# Patient Record
Sex: Female | Born: 1960 | Race: White | Hispanic: No | Marital: Single | Smoking: Never smoker
Health system: Southern US, Community
[De-identification: ages and names within clinical notes are randomized; demographics above are authoritative.]

## PROBLEM LIST (undated history)

## (undated) DIAGNOSIS — M1990 Unspecified osteoarthritis, unspecified site: Secondary | ICD-10-CM

## (undated) DIAGNOSIS — E785 Hyperlipidemia, unspecified: Secondary | ICD-10-CM

## (undated) DIAGNOSIS — I10 Essential (primary) hypertension: Secondary | ICD-10-CM

## (undated) DIAGNOSIS — F329 Major depressive disorder, single episode, unspecified: Secondary | ICD-10-CM

## (undated) DIAGNOSIS — E039 Hypothyroidism, unspecified: Secondary | ICD-10-CM

## (undated) HISTORY — DX: Unspecified osteoarthritis, unspecified site: M19.90

## (undated) HISTORY — DX: Essential (primary) hypertension: I10

## (undated) HISTORY — DX: Hypothyroidism, unspecified: E03.9

## (undated) HISTORY — DX: Major depressive disorder, single episode, unspecified: F32.9

## (undated) HISTORY — DX: Hyperlipidemia, unspecified: E78.5

---

## 1994-02-07 HISTORY — PX: INCISIONAL HERNIA REPAIR: SHX193

## 2007-02-08 HISTORY — PX: FOOT SURGERY: SHX648

## 2008-05-08 LAB — HM MAMMOGRAPHY: HM Mammogram: NORMAL

## 2008-05-08 LAB — CONVERTED CEMR LAB: Pap Smear: NORMAL

## 2009-01-15 ENCOUNTER — Ambulatory Visit: Payer: Self-pay | Admitting: Family Medicine

## 2009-01-15 DIAGNOSIS — E039 Hypothyroidism, unspecified: Secondary | ICD-10-CM

## 2009-01-15 DIAGNOSIS — I10 Essential (primary) hypertension: Secondary | ICD-10-CM

## 2009-01-15 DIAGNOSIS — E785 Hyperlipidemia, unspecified: Secondary | ICD-10-CM

## 2009-01-15 DIAGNOSIS — F3289 Other specified depressive episodes: Secondary | ICD-10-CM

## 2009-01-15 DIAGNOSIS — F329 Major depressive disorder, single episode, unspecified: Secondary | ICD-10-CM

## 2009-01-15 HISTORY — DX: Other specified depressive episodes: F32.89

## 2009-01-15 HISTORY — DX: Major depressive disorder, single episode, unspecified: F32.9

## 2009-01-15 HISTORY — DX: Hyperlipidemia, unspecified: E78.5

## 2009-01-15 HISTORY — DX: Hypothyroidism, unspecified: E03.9

## 2009-01-15 HISTORY — DX: Essential (primary) hypertension: I10

## 2009-02-26 ENCOUNTER — Ambulatory Visit: Payer: Self-pay | Admitting: Family Medicine

## 2009-07-24 ENCOUNTER — Telehealth: Payer: Self-pay | Admitting: Family Medicine

## 2010-01-13 ENCOUNTER — Ambulatory Visit: Payer: Self-pay | Admitting: Family Medicine

## 2010-01-29 ENCOUNTER — Ambulatory Visit: Payer: Self-pay | Admitting: Family Medicine

## 2010-01-29 ENCOUNTER — Other Ambulatory Visit
Admission: RE | Admit: 2010-01-29 | Discharge: 2010-01-29 | Payer: Self-pay | Source: Home / Self Care | Admitting: Family Medicine

## 2010-01-29 ENCOUNTER — Encounter: Payer: Self-pay | Admitting: Family Medicine

## 2010-02-02 LAB — CONVERTED CEMR LAB
Albumin: 4.3 g/dL (ref 3.5–5.2)
Basophils Absolute: 0.1 10*3/uL (ref 0.0–0.1)
Basophils Relative: 1 % (ref 0–1)
Chloride: 102 meq/L (ref 96–112)
HDL: 60 mg/dL (ref >39)
LDL Cholesterol: 126 mg/dL — ABNORMAL HIGH (ref 0–99)
MCHC: 33.7 g/dL (ref 30.0–36.0)
Monocytes Relative: 8 % (ref 3–12)
Neutro Abs: 5.3 10*3/uL (ref 1.7–7.7)
Neutrophils Relative %: 60 % (ref 43–77)
Potassium: 4.7 meq/L (ref 3.5–5.3)
RBC: 4.82 M/uL (ref 3.87–5.11)
RDW: 14.2 % (ref 11.5–15.5)
Total Protein: 6.8 g/dL (ref 6.0–8.3)
Triglycerides: 59 mg/dL (ref <150)
VLDL: 12 mg/dL (ref 0–40)

## 2010-02-04 LAB — CONVERTED CEMR LAB: Pap Smear: NEGATIVE

## 2010-03-09 NOTE — Assessment & Plan Note (Signed)
Summary: CHEST CONGESTION/COUGH/CJR   Vital Signs:  Patient profile:   50 year old female Menstrual status:  perimenopausal Temp:     99 degrees F oral BP sitting:   90 / 70  Vitals Entered By: Sid Falcon LPN (February 26, 2009 9:50 AM) CC: Chest congestion, chest pain with cough, hx bronchitis   History of Present Illness: Acute visit. Onset 4 days ago cough now productive greenish mucus. No history of asthma. No obvious wheezing. Denies any fever. Does have malaise. No hemoptysis. Nonsmoker. Difficulty getting over bronchial infections in the past.  Blood pressures have been well controlled. Currently Diovan 160 mg daily. No recent orthostatic symptoms.  Allergies: 1)  Ferrous Sulfate (Ferrous Sulfate) 2)  Erythromycin Base (Erythromycin Base)  Past History:  Past Medical History: Last updated: 01/15/2009 Depression Hyperlipidemia hypothyroidism  Social History: Last updated: 01/15/2009 Occupation:  Professor Single Never Smoked Alcohol use-yes Regular exercise-yes  Review of Systems      See HPI  Physical Exam  General:  Well-developed,well-nourished,in no acute distress; alert,appropriate and cooperative throughout examination Eyes:  No corneal or conjunctival inflammation noted. EOMI. P[place]la. Funduscopic exam benign, without hemorrhages, exudates or papilledema. Vision grossly normal. Ears:  External ear exam shows no significant lesions or deformities.  Otoscopic examination reveals clear canals, tympanic membranes are intact bilaterally without bulging, retraction, inflammation or discharge. Hearing is grossly normal bilaterally. Mouth:  Oral mucosa and oropharynx without lesions or exudates.  Teeth in good repair. Neck:  No deformities, masses, or tenderness noted. Lungs:  Normal respiratory effort, chest expands symmetrically. Lungs are clear to auscultation, no crackles or wheezes. Heart:  Normal rate and regular rhythm. S1 and S2 normal without gallop,  murmur, click, rub or other extra sounds.   Impression & Recommendations:  Problem # 1:  ACUTE BRONCHITIS (ICD-466.0)  Her updated medication list for this problem includes:    Azithromycin 250 Mg Tabs (Azithromycin) .Marland Kitchen... 2 by mouth today then one by mouth once daily for 4 days  Problem # 2:  HYPERTENSION (ICD-401.9) She will try reducing Diovan to one-half tablet once daily and monitor BP closely. Her updated medication list for this problem includes:    Diovan 160 Mg Tabs (Valsartan) ..... Once daily  Complete Medication List: 1)  Effexor Xr 150 Mg Xr24h-cap (Venlafaxine hcl) .... Once daily 2)  Diovan 160 Mg Tabs (Valsartan) .... Once daily 3)  Levothyroxine Sodium 112 Mcg Tabs (Levothyroxine sodium) .... One by mouth once daily 4)  Alprazolam 0.5 Mg Tabs (Alprazolam) .... One by mouth at bedtime as needed 5)  Azithromycin 250 Mg Tabs (Azithromycin) .... 2 by mouth today then one by mouth once daily for 4 days  Patient Instructions: 1)  Consider over-the-counter Mucinex 2 tablets twice daily 2)  Acute Bronchitis symptoms for less then 10 days are not  helped by antibiotics. Take over the counter cough medications. Call if no improvement in 5-7 days, sooner if increasing cough, fever, or new symptoms ( shortness of breath, chest pain) .  Prescriptions: AZITHROMYCIN 250 MG TABS (AZITHROMYCIN) 2 by mouth today then one by mouth once daily for 4 days  #6 x 0   Entered and Authorized by:   Evelena Peat MD   Signed by:   Evelena Peat MD on 02/26/2009   Method used:   Electronically to        [facility] # [phone]* (retail)       [address]       [place], [place]  [address]       Ph: [phone]       Fax: [phone]   RxID:   [id number]

## 2010-03-09 NOTE — Progress Notes (Signed)
Summary: REFILL REQUEST (Xanax)  Phone Note Refill Request Message from:  Patient on July 24, 2009 12:15 PM  Refills Requested: Medication #1:  ALPRAZOLAM 0.5 MG TABS one by mouth at bedtime as needed   Notes: [facility] [address] [facility] [address].  Pt has appts for labwork / cpx scheduled for December 2011.   Initial call taken by: Debbra Riding,  July 24, 2009 12:16 PM    Prescriptions: ALPRAZOLAM 0.5 MG TABS (ALPRAZOLAM) one by mouth at bedtime as needed  #30 x 0   Entered by:   Sid Falcon LPN   Authorized by:   Evelena Peat MD   Signed by:   Sid Falcon LPN on 36/64/4034   Method used:   Telephoned to ...       [facility] # [phone]* (retail)       [address]       [place], [place]  [address]       Ph: [phone]       Fax: [phone]   RxID:   [id number]

## 2010-03-11 NOTE — Assessment & Plan Note (Signed)
Summary: CPX (PAP) // RS pt rsc/njr   Vital Signs:  Patient profile:   50 year old female Menstrual status:  perimenopausal Height:      56 inches Weight:      110.5 pounds BMI:     24.86 O2 Sat:      94 % Temp:     98.3 degrees F Pulse rate:   82 / minute BP sitting:   112 / 72  (left arm) Cuff size:   regular  Vitals Entered By: Pura Spice, RN (January 29, 2010 1:44 PM) CC: cpx states fasting wants to reduce bp med, Hypertension Management   History of Present Illness: Here for CPE and follow up chronic problems.  Exercising regularly and generally feels well. tetanus is up to date Last pap about one and one half years ago.  Remote hx of "abnormal" pap. We have no record of that.  Apparently no signif dysplasia.  Depression stable on Effexor with good compliance and no side effects. Hypertension which has been very stable.  She wonders if she can scale back  meds.  Hypothryroidism.  NO symptoms of hypo or hyperthyroidism .  Pt compliant with meds.  Hypertension History:      She denies headache, chest pain, palpitations, dyspnea with exertion, orthopnea, peripheral edema, visual symptoms, neurologic problems, syncope, and side effects from treatment.        Positive major cardiovascular risk factors include hyperlipidemia and hypertension.  Negative major cardiovascular risk factors include female age less than 64 years old and non-tobacco-user status.     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 Smoked Alcohol use-yes Regular exercise-yes  Risk Factors: Exercise: yes (01/15/2009)  Risk Factors: Smoking Status: never (01/15/2009) PMH-FH-SH reviewed for relevance  Family History: Family History High cholesterol Family History Hypertension Adopted  Review of Systems  The patient denies anorexia, fever, weight loss, weight gain, vision loss, decreased hearing, hoarseness, chest pain, syncope, dyspnea on exertion, peripheral edema, prolonged cough, headaches, hemoptysis, abdominal pain, hematochezia, severe indigestion/heartburn, hematuria, incontinence, genital sores, muscle weakness, suspicious skin lesions, transient blindness, difficulty walking, depression, unusual weight change, abnormal bleeding, enlarged lymph nodes, and breast masses.         Flu Vaccine Consent Questions     Do you have a history of severe allergic reactions to this vaccine? no    Any prior history of allergic reactions to egg and/or gelatin? no    Do you have a sensitivity to the preservative Thimersol? no    Do you have a past history of Guillan-Barre Syndrome? no    Do you currently have an acute febrile illness? no    Have you ever had a severe reaction to latex? no    Vaccine information given and explained to patient? yes    Are you currently pregnant? no    Lot Number:[id number] Fluzone  sanofi    Exp Date:08/07/2010   Site Given  Left Deltoid IM Pura Spice, RN  January 29, 2010 2:39 PM  Physical Exam  General:  Well-developed,well-nourished,in no acute distress; alert,appropriate and cooperative throughout examination Head:  normocephalic and atraumatic.   Eyes:  No corneal or conjunctival inflammation noted. EOMI. P[place]la. Funduscopic exam benign, without hemorrhages, exudates or papilledema. Vision grossly normal. Ears:  External ear exam shows no significant lesions or deformities.   Otoscopic examination reveals clear canals, tympanic membranes are intact bilaterally without bulging, retraction, inflammation or discharge. Hearing is grossly normal bilaterally. Mouth:  Oral mucosa and oropharynx without lesions or exudates.  Teeth in good repair. Neck:  No deformities, masses, or tenderness noted. Chest Wall:  No deformities, masses, or tenderness noted. Breasts:  No mass, nodules, thickening, tenderness, bulging, retraction, inflamation, nipple discharge or skin changes noted.   Lungs:  Normal respiratory effort, chest expands symmetrically. Lungs are clear to auscultation, no crackles or wheezes. Heart:  Normal rate and regular rhythm. S1 and S2 normal without gallop, murmur, click, rub or other extra sounds. Abdomen:  Bowel sounds positive,abdomen soft and non-tender without masses, organomegaly or hernias noted. Genitalia:  Normal introitus for age, no external lesions, no vaginal discharge, mucosa pink and moist, no vaginal or cervical lesions, no vaginal atrophy, no friaility or hemorrhage, normal uterus size and position, no adnexal masses or tenderness Msk:  No deformity or scoliosis noted of thoracic or lumbar spine.   Extremities:  No clubbing, cyanosis, edema, or deformity noted with normal full range of motion of all joints.   Neurologic:  alert & oriented X3 and cranial nerves II-XII intact.   Skin:  Intact without suspicious lesions or rashes Cervical Nodes:  No lymphadenopathy noted Psych:  Oriented X3, memory intact for recent and remote, and normally interactive.     Impression & Recommendations:  Problem # 1:  Preventive Health Care (ICD-V70.0) healthy 50 yo female.  Screening labs and Pap obtained. Schedule mammogram by next spring.  Problem # 2:  HYPOTHYROIDISM (ICD-244.9)  Her updated medication list for this problem includes:    Levothroid 100 Mcg Tabs (Levothyroxine sodium) ..... Once daily  Problem # 3:  HYPERTENSION (ICD-401.9) try reducing  to one half tablet daily. Her updated medication list for this problem includes:    Losartan Potassium 50 Mg Tabs (Losartan potassium) ..... One by mouth once daily  Problem # 4:  DEPRESSION (ICD-311) refill medication. Her updated medication list for this problem includes:    Effexor Xr 150 Mg Xr24h-cap (Venlafaxine hcl) ..... Once daily    Alprazolam 0.5 Mg Tabs (Alprazolam) ..... One by mouth at bedtime as needed  Complete Medication List: 1)  Effexor Xr 150 Mg Xr24h-cap (Venlafaxine hcl) .... Once daily 2)  Losartan Potassium 50 Mg Tabs (Losartan potassium) .... One by mouth once daily 3)  Levothroid 100 Mcg Tabs (Levothyroxine sodium) .... Once daily 4)  Alprazolam 0.5 Mg Tabs (Alprazolam) .... One by mouth at bedtime as needed  Other Orders: TLB-Lipid Panel (80061-LIPID) TLB-BMP (Basic Metabolic Panel-BMET) (80048-METABOL) TLB-CBC Platelet - w/Differential (85025-CBCD) TLB-Hepatic/Liver Function Pnl (80076-HEPATIC) TLB-TSH (Thyroid Stimulating Hormone) (84443-TSH) Pap Smear, Thin Prep ( Collection of) (E4540) Admin 1st Vaccine (98119) Flu Vaccine 59yrs + (14782) Specimen Handling (95621) Venipuncture (30865)  Hypertension Assessment/Plan:      The patient's hypertensive risk group is category B: At least one risk factor (excluding diabetes) with no target organ damage.  Today's blood pressure is 112/72.    Patient Instructions: 1)  Please schedule a follow-up appointment as needed .  2)  Schedule your mammogram.  3)  Check your  Blood Pressure  regularly . If it is above:140/90   you should make an appointment. Prescriptions: ALPRAZOLAM 0.5 MG TABS (ALPRAZOLAM) one by mouth at bedtime as needed  #30 x 0   Entered and Authorized by:   Evelena Peat MD   Signed by:   Evelena Peat MD on 01/29/2010   Method used:   Print then Give to Patient   RxID:   [id number] LEVOTHYROXINE SODIUM 112 MCG TABS (LEVOTHYROXINE SODIUM) one by mouth once daily  #90 x 3   Entered and  Authorized by:   Evelena Peat MD   Signed by:   Evelena Peat MD on 01/29/2010   Method used:   Faxed to ...       [facility] (mail-order)             , [place]         Ph: [phone]       Fax: [phone]   RxID:   [id number] EFFEXOR XR 150 MG XR24H-CAP (VENLAFAXINE HCL) once daily  #90 x 3   Entered and Authorized by:   Evelena Peat MD   Signed by:   Evelena Peat MD on 01/29/2010   Method used:   Faxed to ...       [facility] (mail-order)             , [place]         Ph: [phone]       Fax: [phone]   RxID:   [id number] LOSARTAN POTASSIUM 50 MG TABS (LOSARTAN POTASSIUM) one by mouth once daily  #90 x 3   Entered and Authorized by:   Evelena Peat MD   Signed by:   Evelena Peat MD on 01/29/2010   Method used:   Faxed to ...       [facility] (mail-order)             , [place]         Ph: [phone]       Fax: [phone]   RxID:   [id number]    Orders Added: 1)  Est. Patient 40-64 years [99396] 2)  TLB-Lipid Panel [80061-LIPID] 3)  TLB-BMP (Basic Metabolic Panel-BMET) [80048-METABOL] 4)  TLB-CBC Platelet - w/Differential [85025-CBCD] 5)  TLB-Hepatic/Liver Function Pnl [80076-HEPATIC] 6)  TLB-TSH (Thyroid Stimulating Hormone) [84443-TSH] 7)  Pap Smear, Thin Prep ( Collection of) [Q0091] 8)  Admin 1st Vaccine [90471] 9)  Flu Vaccine 76yrs + [17616] 10)  Specimen Handling [99000] 11)  Venipuncture [36415] 12)  Est. Patient Level III [07371]

## 2010-03-27 ENCOUNTER — Ambulatory Visit (INDEPENDENT_AMBULATORY_CARE_PROVIDER_SITE_OTHER): Payer: BC Managed Care – PPO | Admitting: Family Medicine

## 2010-03-27 ENCOUNTER — Encounter: Payer: Self-pay | Admitting: Family Medicine

## 2010-03-27 DIAGNOSIS — J209 Acute bronchitis, unspecified: Secondary | ICD-10-CM

## 2010-03-31 NOTE — Assessment & Plan Note (Signed)
Summary: VERY SICK(COLD)/VJ   Vital Signs:  Patient profile:   50 year old female Menstrual status:  perimenopausal Weight:      110 pounds BMI:     24.75 O2 Sat:      98 % Temp:     98.3 degrees F Pulse rate:   81 / minute Pulse rhythm:   regular BP sitting:   118 / 86  (left arm) Cuff size:   regular  Vitals Entered By: Lamar Sprinkles, CMA (March 27, 2010 12:09 PM) CC: cough,sinus congestion/SD   History of Present Illness: 50 yo woman here today for cough and sinus infxn.  reports hx of recurrent bronchitis.  sxs started Monday w/ sore throat and HA.  bilateral ear pain.  Tm 101.  + sick contacts.  cough is productive of thick yellow sputum.  has been taking tylenol and OTC sinus med.  + facial pain/pressure.  Current Medications (verified): 1)  Effexor Xr 150 Mg Xr24h-Cap (Venlafaxine Hcl) .... Once Daily 2)  Losartan Potassium 50 Mg Tabs (Losartan Potassium) .... One By Mouth Once Daily 3)  Levothroid 100 Mcg Tabs (Levothyroxine Sodium) .... Once Daily 4)  Alprazolam 0.5 Mg Tabs (Alprazolam) .... One By Mouth At Bedtime As Needed 5)  Augmentin 875-125 Mg Tabs (Amoxicillin-Pot Clavulanate) .Marland Kitchen.. 1 By Mouth 2 Times Daily 6)  Cheratussin Ac 100-10 Mg/86ml Syrp (Guaifenesin-Codeine) .Marland Kitchen.. 1-2 Tsps Q4-6 As Needed For Cough  Allergies: 1)  ! Sulfa 2)  Ferrous Sulfate 3)  Erythromycin Base (Erythromycin Base)  Review of Systems      See HPI  Physical Exam  General:  Well-developed,well-nourished,in no acute distress; alert,appropriate and cooperative throughout examination Head:  normocephalic and atraumatic.  no TTP over sinuses Eyes:  no injxn or inflammation Ears:  TMs retracted bilaterally Nose:  + congestion Mouth:  Oral mucosa and oropharynx without lesions or exudates.  Teeth in good repair. Neck:  No deformities, masses, or tenderness noted. Lungs:  Normal respiratory effort, chest expands symmetrically. Lungs are clear to auscultation, no crackles or wheezes.  +  hacking cough Heart:  Normal rate and regular rhythm. S1 and S2 normal without gallop, murmur, click, rub or other extra sounds.   Impression & Recommendations:  Problem # 1:  BRONCHITIS- ACUTE (ICD-466.0) Assessment New pt's sxs consistent w/ bronchitis.  prescribed amox but pt then reported that this is often ineffective for her.  switch to augmentin.  cough meds as needed.  reviewed supportive care and red flags that should prompt return.  Pt expresses understanding and is in agreement w/ this plan. Her updated medication list for this problem includes:    Augmentin 875-125 Mg Tabs (Amoxicillin-pot clavulanate) .Marland Kitchen... 1 by mouth 2 times daily    Cheratussin Ac 100-10 Mg/67ml Syrp (Guaifenesin-codeine) .Marland Kitchen... 1-2 tsps q4-6 as needed for cough  Complete Medication List: 1)  Effexor Xr 150 Mg Xr24h-cap (Venlafaxine hcl) .... Once daily 2)  Losartan Potassium 50 Mg Tabs (Losartan potassium) .... One by mouth once daily 3)  Levothroid 100 Mcg Tabs (Levothyroxine sodium) .... Once daily 4)  Alprazolam 0.5 Mg Tabs (Alprazolam) .... One by mouth at bedtime as needed 5)  Augmentin 875-125 Mg Tabs (Amoxicillin-pot clavulanate) .Marland Kitchen.. 1 by mouth 2 times daily 6)  Cheratussin Ac 100-10 Mg/2ml Syrp (Guaifenesin-codeine) .Marland Kitchen.. 1-2 tsps q4-6 as needed for cough  Patient Instructions: 1)  You have a bronchitis 2)  Take the Amoxicillin as directed- take w/ food to avoid upset stomach 3)  Use the cough medicine as  directed- it will make you sleepy 4)  Drink plenty of fluids 5)  Mucinex to thin your congestion and drainage 6)  Tylenol or ibuprofen as needed for pain or fever 7)  Call with any questions or concerns 8)  Hang in there!!! Prescriptions: AUGMENTIN 875-125 MG TABS (AMOXICILLIN-POT CLAVULANATE) 1 by mouth 2 times daily  #20 x 0   Entered and Authorized by:   Neena Rhymes MD   Signed by:   Neena Rhymes MD on 03/27/2010   Method used:   Electronically to        [facility]   [phone]* (retail)       [address] [place], [place]  [address]       Ph: [phone] or [phone]       Fax: [phone]   RxID:   [id number] CHERATUSSIN AC 100-10 MG/5ML SYRP (GUAIFENESIN-CODEINE) 1-2 tsps Q4-6 as needed for cough  #150 x 0   Entered and Authorized by:   Neena Rhymes MD   Signed by:   Neena Rhymes MD on 03/27/2010   Method used:   Print then Give to Patient   RxID:   [id number] AMOXICILLIN 875 MG TABS (AMOXICILLIN) 1 tab by mouth two times a day x10 days.  take w/ food.  #20 x 0   Entered and Authorized by:   Neena Rhymes MD   Signed by:   Neena Rhymes MD on 03/27/2010   Method used:   Electronically to        [facility]  [phone]* (retail)       [address] [place], [place]  [address]       Ph: [phone] or [phone]       Fax: [phone]   RxID:   [id number]    Orders Added: 1)  Est. Patient Level III [44315]

## 2010-05-24 ENCOUNTER — Encounter: Payer: Self-pay | Admitting: Family Medicine

## 2010-05-25 ENCOUNTER — Ambulatory Visit (INDEPENDENT_AMBULATORY_CARE_PROVIDER_SITE_OTHER): Payer: BC Managed Care – PPO | Admitting: Family Medicine

## 2010-05-25 ENCOUNTER — Encounter: Payer: Self-pay | Admitting: Family Medicine

## 2010-05-25 VITALS — BP 110/90 | Temp 98.3°F | Ht <= 58 in | Wt 111.0 lb

## 2010-05-25 DIAGNOSIS — E039 Hypothyroidism, unspecified: Secondary | ICD-10-CM

## 2010-05-25 DIAGNOSIS — Z7189 Other specified counseling: Secondary | ICD-10-CM

## 2010-05-25 NOTE — Patient Instructions (Signed)
We will call you regarding referral to nutritionist.

## 2010-05-25 NOTE — Progress Notes (Signed)
  Subjective:    Patient ID: Sophia Russell, female    DOB: 31-May-1960, 50 y.o.   MRN: [id number]  HPI Patient is seen with concerns regarding difficulty losing weight. She is especially concerned regarding fat distribution most around the waist region. She is adopted so family history is unknown. She has no history of diabetes. She has hypothyroidism replaced with levothyroxine. Also has hypertension which has been well controlled with Cozaar. She has history of depression controlled with low-dose Effexor. No other medications.  She's not had any weight gain but difficulty losing weight. She is currently consuming 1000 calories per day (by her count) and exercising 4-5 days per week. Mostly aerobic exercise with some resistance training as well. She has increased fat distribution especially at the waist. No bloating or appetite changes. Denies any abdominal or pelvic pain.   Review of Systems  Constitutional: Negative for appetite change, fatigue and unexpected weight change.  Respiratory: Negative for cough and shortness of breath.   Cardiovascular: Negative for chest pain.  Gastrointestinal: Negative for nausea, vomiting, abdominal pain, diarrhea, constipation, blood in stool and abdominal distention.  Hematological: Negative for adenopathy. Does not bruise/bleed easily.       Objective:   Physical Exam  Constitutional: She appears well-developed and well-nourished.  Cardiovascular: Normal rate, regular rhythm and normal heart sounds.   No murmur heard. Pulmonary/Chest: Effort normal and breath sounds normal. No respiratory distress. She has no wheezes. She has no rales.  Musculoskeletal: She exhibits no edema.          Assessment & Plan:  Patient presents with difficulty losing weight. She's not on any medications that should exacerbate weight gain. More concerning to the patient is her difficulty losing body fat especially around the waist region. She is following a very low  calorie diet and exercising fairly consistently. We discussed possibly setting her up to see nutritionist for further evaluation. She had normal thyroid functions last fall.  We discussed mixing up her exercise regimen to use different muscle groups.

## 2010-06-22 ENCOUNTER — Other Ambulatory Visit: Payer: Self-pay | Admitting: Family Medicine

## 2010-06-22 DIAGNOSIS — Z1231 Encounter for screening mammogram for malignant neoplasm of breast: Secondary | ICD-10-CM

## 2010-06-27 LAB — HM MAMMOGRAPHY: HM Mammogram: NEGATIVE

## 2010-06-30 ENCOUNTER — Ambulatory Visit
Admission: RE | Admit: 2010-06-30 | Discharge: 2010-06-30 | Disposition: A | Discharge status: Home / Self Care | Payer: BC Managed Care – PPO | Source: Ambulatory Visit | Attending: Family Medicine | Admitting: Family Medicine

## 2010-06-30 DIAGNOSIS — Z1231 Encounter for screening mammogram for malignant neoplasm of breast: Secondary | ICD-10-CM

## 2010-09-29 ENCOUNTER — Other Ambulatory Visit: Payer: Self-pay | Admitting: Family Medicine

## 2010-10-01 NOTE — Telephone Encounter (Signed)
Last filled 01/29/10 # 30 with 0 refills

## 2010-10-01 NOTE — Telephone Encounter (Signed)
May refill 

## 2010-10-04 NOTE — Telephone Encounter (Signed)
Pt call regarding ALPRAZolam (XANAX) 0.5 MG tablet pharmacy does not have refill request please resend.

## 2010-10-04 NOTE — Telephone Encounter (Signed)
Last filled on 01/29/10, #30 with 0 refills Last OV 4/12

## 2010-11-25 ENCOUNTER — Other Ambulatory Visit: Payer: Self-pay | Admitting: *Deleted

## 2010-11-25 MED ORDER — LEVOTHYROXINE SODIUM 100 MCG PO TABS: 100.0000 ug | ORAL_TABLET | Freq: Every day | ORAL | Status: DC

## 2011-01-21 ENCOUNTER — Telehealth: Payer: Self-pay | Admitting: Family Medicine

## 2011-01-21 MED ORDER — LOSARTAN POTASSIUM 50 MG PO TABS: 50.0000 mg | ORAL_TABLET | Freq: Every day | ORAL | Status: DC

## 2011-01-21 MED ORDER — VENLAFAXINE HCL ER 150 MG PO CP24: 150.0000 mg | ORAL_CAPSULE | Freq: Every day | ORAL | Status: DC

## 2011-01-21 NOTE — Telephone Encounter (Signed)
Refill venaflaxine and generic Lorsartan to [facility], until her cpx on January. Thanks.

## 2011-01-26 ENCOUNTER — Other Ambulatory Visit: Payer: BC Managed Care – PPO

## 2011-02-02 ENCOUNTER — Encounter: Payer: BC Managed Care – PPO | Admitting: Family Medicine

## 2011-02-16 ENCOUNTER — Other Ambulatory Visit: Payer: BC Managed Care – PPO

## 2011-02-18 ENCOUNTER — Other Ambulatory Visit (INDEPENDENT_AMBULATORY_CARE_PROVIDER_SITE_OTHER): Payer: BC Managed Care – PPO

## 2011-02-18 DIAGNOSIS — Z0000 Encounter for general adult medical examination without abnormal findings: Secondary | ICD-10-CM

## 2011-02-18 LAB — BASIC METABOLIC PANEL
BUN: 14 mg/dL (ref 6–23)
GFR: 105.11 mL/min (ref >60.00)
Glucose, Bld: 86 mg/dL (ref 70–99)
Potassium: 4.4 mEq/L (ref 3.5–5.1)

## 2011-02-18 LAB — CBC WITH DIFFERENTIAL/PLATELET
Basophils Absolute: 0 10*3/uL (ref 0.0–0.1)
HCT: 40.5 % (ref 36.0–46.0)
Lymphs Abs: 1.8 10*3/uL (ref 0.7–4.0)
Monocytes Absolute: 0.4 10*3/uL (ref 0.1–1.0)
Monocytes Relative: 8.2 % (ref 3.0–12.0)
Platelets: 299 10*3/uL (ref 150.0–400.0)
RDW: 15.2 % — ABNORMAL HIGH (ref 11.5–14.6)

## 2011-02-18 LAB — POCT URINALYSIS DIPSTICK
Ketones, UA: NEGATIVE
Protein, UA: NEGATIVE
Spec Grav, UA: 1.015
pH, UA: 6

## 2011-02-18 LAB — HEPATIC FUNCTION PANEL: Total Bilirubin: 0.6 mg/dL (ref 0.3–1.2)

## 2011-02-18 LAB — TSH: TSH: 0.17 u[IU]/mL — ABNORMAL LOW (ref 0.35–5.50)

## 2011-02-18 LAB — LIPID PANEL
Cholesterol: 235 mg/dL — ABNORMAL HIGH (ref 0–200)
HDL: 66.8 mg/dL (ref >39.00)
Triglycerides: 66 mg/dL (ref 0.0–149.0)

## 2011-02-23 ENCOUNTER — Encounter: Payer: Self-pay | Admitting: Family Medicine

## 2011-02-24 ENCOUNTER — Encounter: Payer: Self-pay | Admitting: Family Medicine

## 2011-02-24 ENCOUNTER — Other Ambulatory Visit (HOSPITAL_COMMUNITY)
Admission: RE | Admit: 2011-02-24 | Discharge: 2011-02-24 | Disposition: A | Discharge status: Home / Self Care | Payer: BC Managed Care – PPO | Source: Ambulatory Visit | Attending: Family Medicine | Admitting: Family Medicine

## 2011-02-24 ENCOUNTER — Ambulatory Visit (INDEPENDENT_AMBULATORY_CARE_PROVIDER_SITE_OTHER): Payer: BC Managed Care – PPO | Admitting: Family Medicine

## 2011-02-24 VITALS — BP 120/82 | HR 72 | Temp 98.7°F | Resp 12 | Ht <= 58 in | Wt 116.0 lb

## 2011-02-24 DIAGNOSIS — Z01419 Encounter for gynecological examination (general) (routine) without abnormal findings: Secondary | ICD-10-CM | POA: Insufficient documentation

## 2011-02-24 DIAGNOSIS — E039 Hypothyroidism, unspecified: Secondary | ICD-10-CM

## 2011-02-24 DIAGNOSIS — I10 Essential (primary) hypertension: Secondary | ICD-10-CM

## 2011-02-24 DIAGNOSIS — Z23 Encounter for immunization: Secondary | ICD-10-CM

## 2011-02-24 DIAGNOSIS — F329 Major depressive disorder, single episode, unspecified: Secondary | ICD-10-CM

## 2011-02-24 DIAGNOSIS — Z0000 Encounter for general adult medical examination without abnormal findings: Secondary | ICD-10-CM

## 2011-02-24 DIAGNOSIS — L989 Disorder of the skin and subcutaneous tissue, unspecified: Secondary | ICD-10-CM

## 2011-02-24 DIAGNOSIS — E785 Hyperlipidemia, unspecified: Secondary | ICD-10-CM

## 2011-02-24 MED ORDER — TETANUS-DIPHTH-ACELL PERTUSSIS 5-2.5-18.5 LF-MCG/0.5 IM SUSP: 0.5000 mL | Freq: Once | INTRAMUSCULAR | Status: DC

## 2011-02-24 MED ORDER — LEVOTHYROXINE SODIUM 88 MCG PO TABS: 88.0000 ug | ORAL_TABLET | Freq: Every day | ORAL | Status: DC

## 2011-02-24 MED ORDER — LOSARTAN POTASSIUM 50 MG PO TABS: 50.0000 mg | ORAL_TABLET | Freq: Every day | ORAL | Status: DC

## 2011-02-24 MED ORDER — VENLAFAXINE HCL ER 150 MG PO CP24: 150.0000 mg | ORAL_CAPSULE | Freq: Every day | ORAL | Status: DC

## 2011-02-24 NOTE — Progress Notes (Signed)
Subjective:    Patient ID: Sophia Russell, female    DOB: Jul 09, 1960, 51 y.o.   MRN: [id number]  HPI  Here for complete physical examination. She has history of hypertension, depression, and hypothyroidism. Mild hyperlipidemia. She is adopted so family history unknown. Last tetanus unknown. Needs flu vaccine. Planning trip to [place] this summer and will need additional immunizations before then.  Last Pap smear a year ago normal. She had some atypical cells several years ago and requests yearly Pap smear.  Recurrent asymptomatic scaly lesion R cheek region.  Liquid N previously by another provider but this recurred.    Past Medical History  Diagnosis Date  . DEPRESSION 01/15/2009  . HYPERLIPIDEMIA 01/15/2009  . HYPERTENSION 01/15/2009  . HYPOTHYROIDISM 01/15/2009   Past Surgical History  Procedure Date  . Cesarean section     x2  . Foot surgery     x2    reports that she has never smoked. She does not have any smokeless tobacco history on file. Her alcohol and drug histories not on file. family history includes Hyperlipidemia in an unspecified family member and Hypertension in an unspecified family member.  She is adopted. Allergies  Allergen Reactions  . Erythromycin Base     REACTION: severe stomach cramps  . Ferrous Sulfate     REACTION: severe stomach cramps  . Sulfonamide Derivatives       Review of Systems  Constitutional: Negative for fever, activity change, appetite change, fatigue and unexpected weight change.  HENT: Negative for hearing loss, ear pain, sore throat and trouble swallowing.   Eyes: Negative for visual disturbance.  Respiratory: Negative for cough and shortness of breath.   Cardiovascular: Negative for chest pain and palpitations.  Gastrointestinal: Negative for abdominal pain, diarrhea, constipation and blood in stool.  Genitourinary: Negative for dysuria and hematuria.  Musculoskeletal: Negative for myalgias, back pain and arthralgias.  Skin:  Negative for rash.  Neurological: Negative for dizziness, syncope and headaches.  Hematological: Negative for adenopathy.  Psychiatric/Behavioral: Negative for confusion and dysphoric mood.       Objective:   Physical Exam  Constitutional: She is oriented to person, place, and time. She appears well-developed and well-nourished.  HENT:  Head: Normocephalic and atraumatic.  Eyes: EOM are normal. Pupils are equal, round, and reactive to light.  Neck: Normal range of motion. Neck supple. No thyromegaly present.  Cardiovascular: Normal rate, regular rhythm and normal heart sounds.   No murmur heard. Pulmonary/Chest: Breath sounds normal. No respiratory distress. She has no wheezes. She has no rales.  Abdominal: Soft. Bowel sounds are normal. She exhibits no distension and no mass. There is no tenderness. There is no rebound and no guarding.  Musculoskeletal: Normal range of motion. She exhibits no edema.  Lymphadenopathy:    She has no cervical adenopathy.  Neurological: She is alert and oriented to person, place, and time. She displays normal reflexes. No cranial nerve deficit.  Skin: No rash noted.       Just slightly scaly area right cheek with no nodular changes. No ulceration. Area involved is approximately 4 x 4 millimeters  Psychiatric: She has a normal mood and affect. Her behavior is normal. Judgment and thought content normal.          Assessment & Plan:  #1 health maintenance. Tetanus and flu vaccines given. Patient is getting every other year mammogram with normal mammogram a year ago. Repeat Pap smear today. Labs reviewed with patient. Discussed low saturated fat #2 hypothyroidism. Over replaced.  Reduce levothyroxine 88 mcg daily and recheck in 3 months  #3 hyperlipidemia. Discussed measures to help reduce. She has good HDL overall low-risk lumbar  #4 recurrent scaly lesion right cheek, question actinic keratosis recurrent. Referral to dermatology

## 2011-02-24 NOTE — Patient Instructions (Signed)
Be sure to get typhoid vaccine and malaria prevention before trip to [place] this summer. Consider typhoid vaccine in May at the latest Follow up labs in 3 months to reassess thyroid and lipid.

## 2011-02-27 LAB — HM PAP SMEAR: HM Pap smear: NEGATIVE

## 2011-03-03 NOTE — Progress Notes (Signed)
Quick Note:  Pt informed ______ 

## 2011-05-20 ENCOUNTER — Other Ambulatory Visit: Payer: Self-pay | Admitting: Physician Assistant

## 2011-07-28 ENCOUNTER — Encounter: Payer: Self-pay | Admitting: Family Medicine

## 2011-07-28 ENCOUNTER — Ambulatory Visit (INDEPENDENT_AMBULATORY_CARE_PROVIDER_SITE_OTHER): Payer: BC Managed Care – PPO | Admitting: Family Medicine

## 2011-07-28 VITALS — BP 140/90 | Temp 98.0°F | Wt 118.0 lb

## 2011-07-28 DIAGNOSIS — E785 Hyperlipidemia, unspecified: Secondary | ICD-10-CM

## 2011-07-28 DIAGNOSIS — N951 Menopausal and female climacteric states: Secondary | ICD-10-CM

## 2011-07-28 DIAGNOSIS — R5381 Other malaise: Secondary | ICD-10-CM

## 2011-07-28 DIAGNOSIS — E039 Hypothyroidism, unspecified: Secondary | ICD-10-CM

## 2011-07-28 DIAGNOSIS — R5383 Other fatigue: Secondary | ICD-10-CM

## 2011-07-28 LAB — LIPID PANEL
HDL: 78 mg/dL (ref >39.00)
Triglycerides: 82 mg/dL (ref 0.0–149.0)

## 2011-07-28 NOTE — Progress Notes (Signed)
  Subjective:    Patient ID: Sophia Russell, female    DOB: Mar 27, 1960, 51 y.o.   MRN: [id number]  HPI  Patient seen with increasing fatigue and hot flashes.  No menstrual cycle about 2 and one half months. Prior history of bilateral tubal ligation. She is noticing decreased libido and decreased orgasm with intercourse. She is concerned she may have thyroid issue. She has hypothyroidism treated with levothyroxine 88 mcg daily. She was over replaced at labs 5 months ago we had reduced her dosage. She takes losartan for hypertension and has been on Effexor for several years. She has noticed significant change the past couple months. She has had increasing hot flashes. Denies depressive symptoms. Still exercises but irregularly. Good appetite.  Past Medical History  Diagnosis Date  . DEPRESSION 01/15/2009  . HYPERLIPIDEMIA 01/15/2009  . HYPERTENSION 01/15/2009  . HYPOTHYROIDISM 01/15/2009   Past Surgical History  Procedure Date  . Cesarean section     x2  . Foot surgery     x2    reports that she has never smoked. She does not have any smokeless tobacco history on file. Her alcohol and drug histories not on file. family history includes Hyperlipidemia in an unspecified family member and Hypertension in an unspecified family member.  She is adopted. Allergies  Allergen Reactions  . Erythromycin Base     REACTION: severe stomach cramps  . Ferrous Sulfate     REACTION: severe stomach cramps  . Sulfonamide Derivatives      Review of Systems  Constitutional: Negative for fatigue.  Eyes: Negative for visual disturbance.  Respiratory: Negative for cough, chest tightness, shortness of breath and wheezing.   Cardiovascular: Negative for chest pain, palpitations and leg swelling.  Neurological: Negative for dizziness, seizures, syncope, weakness, light-headedness and headaches.       Objective:   Physical Exam  Constitutional: She appears well-developed and well-nourished.  Neck: Neck  supple. No thyromegaly present.  Cardiovascular: Normal rate and regular rhythm.   Pulmonary/Chest: Effort normal and breath sounds normal. No respiratory distress. She has no wheezes. She has no rales.  Musculoskeletal: She exhibits no edema.  Lymphadenopathy:    She has no cervical adenopathy.          Assessment & Plan:  #1 hypertension. Marginal control. Increase aerobic exercise and continue monitoring #2 hypothyroidism. Recheck TSH. #3 hot flashes. Probable perimenopause. Patient requests hormonal assessment to determine. We'll check FSH and LH levels. She has not decided at this point whether she would consider brief hormonal replacement. #4 hyperlipidemia. Recheck lipid panel

## 2011-07-29 LAB — FOLLICLE STIMULATING HORMONE: FSH: 29.6 m[IU]/mL

## 2011-07-29 LAB — LUTEINIZING HORMONE: LH: 25.29 m[IU]/mL

## 2011-08-02 NOTE — Progress Notes (Signed)
Quick Note:  Pt informed ______ 

## 2011-08-24 ENCOUNTER — Telehealth: Payer: Self-pay | Admitting: Family Medicine

## 2011-08-24 NOTE — Telephone Encounter (Signed)
Caller: Ashima/Patient; PCP: Evelena Peat; CB#: [phone]. Call regarding Toe Nail Fungas. Caller reports she had a fungas of her toenails appx 10 yrs ago and was treated with Lamisil. She would like RX for same. Caller advised she must be seen for eval and tx. Caller states, "I really do not want to come in, I know what it is." Caller advised she must be seen and is agreeable to appt. Scheduled for Friday (as requested) 7/19 at 13:45 with Dr Caryl Never. Caller is agreeable.

## 2011-08-26 ENCOUNTER — Encounter: Payer: Self-pay | Admitting: Family Medicine

## 2011-08-26 ENCOUNTER — Ambulatory Visit (INDEPENDENT_AMBULATORY_CARE_PROVIDER_SITE_OTHER): Payer: BC Managed Care – PPO | Admitting: Family Medicine

## 2011-08-26 VITALS — BP 124/78 | Temp 98.6°F | Wt 117.0 lb

## 2011-08-26 DIAGNOSIS — B351 Tinea unguium: Secondary | ICD-10-CM

## 2011-08-26 MED ORDER — TERBINAFINE HCL 250 MG PO TABS: 250.0000 mg | ORAL_TABLET | Freq: Every day | ORAL | Status: DC

## 2011-08-26 NOTE — Progress Notes (Signed)
  Subjective:    Patient ID: Sophia Russell, female    DOB: [birth year], 51 y.o.   MRN: [id number]  HPI  Patient seen with dystrophic changes left great toe. She relates about 12 years ago she was treated with Lamisil for onychomycosis. She's had similar occurrence nail. She wishes to be treated with Lamisil. She is aware that topicals are not effective. She denies pain. No history of diabetes. No alleviating factors. No history of hepatic dysfunction. No prior intolerance to Lamisil.   Review of Systems  Constitutional: Negative for fever and chills.       Objective:   Physical Exam  Constitutional: She appears well-developed and well-nourished.  Cardiovascular: Normal rate and regular rhythm.   Musculoskeletal:       Left great toe reveals dystrophic changes distally. We clipped off portion of nail. She has brittle changes underneath.          Assessment & Plan:  Probable onychomycosis great toe. Nail clipping sent for culture. Lamisil 250 mg once daily for 3 months. She had recent liver functions which were normal.

## 2011-08-26 NOTE — Patient Instructions (Addendum)
Ringworm, Nail  A fungal infection of the nail (tinea unguium/onychomycosis) is common. It is common as the visible part of the nail is composed of dead cells which have no blood supply to help prevent infection. It occurs because fungi are everywhere and will pick any opportunity to grow on any dead material.  Because nails are very slow growing they require up to 2 years of treatment with anti-fungal medications. The entire nail back to the base is infected. This includes approximately ? of the nail which you cannot see.  If your caregiver has prescribed a medication by mouth, take it every day and as directed. No progress will be seen for at least 6 to 9 months. Do not be disappointed! Because fungi live on dead cells with little or no exposure to blood supply, medication delivery to the infection is slow; thus the cure is slow. It is also why you can observe no progress in the first 6 months. The nail becoming cured is the base of the nail, as it has the blood supply. Topical medication such as creams and ointments are usually not effective. Important in successful treatment of nail fungus is closely following the medication regimen that your doctor prescribes.  Sometimes you and your caregiver may elect to speed up this process by surgical removal of all the nails. Even this may still require 6 to 9 months of additional oral medications.  See your caregiver as directed. Remember there will be no visible improvement for at least 6 months. See your caregiver sooner if other signs of infection (redness and swelling) develop.  Document Released: 01/22/2000 Document Revised: 01/13/2011 Document Reviewed: 04/01/2008  Exit[employer] Patient Information 2012 [employer].

## 2011-08-29 ENCOUNTER — Other Ambulatory Visit: Payer: Self-pay | Admitting: *Deleted

## 2011-08-29 MED ORDER — TERBINAFINE HCL 250 MG PO TABS: 250.0000 mg | ORAL_TABLET | Freq: Every day | ORAL | Status: DC

## 2011-08-29 NOTE — Telephone Encounter (Signed)
If medication is filled at [facility] then the cost is only $4 and does not require a prior auth.

## 2011-09-01 ENCOUNTER — Other Ambulatory Visit: Payer: Self-pay | Admitting: *Deleted

## 2011-09-01 MED ORDER — TERBINAFINE HCL 250 MG PO TABS: 250.0000 mg | ORAL_TABLET | Freq: Every day | ORAL | Status: DC

## 2011-09-02 ENCOUNTER — Telehealth: Payer: Self-pay | Admitting: Family Medicine

## 2011-09-02 NOTE — Telephone Encounter (Signed)
Sophia Russell, I received another Prior Auth from [facility] on this patiant's Lamisil 250mg  tab. I called them and told the pharmacy insurance would not cover. They ran a claim, and for [facility] to get the 90 pills, it is $15.99. I called the patient to let her know, and had to [facility]. If she should call & get you, please relay this information: insurance will not cover, and it will cost her $15.99 out of pocket. Thanks!

## 2011-09-05 NOTE — Telephone Encounter (Signed)
Spoke with patient - she was able to go to [facility] and get the pills.

## 2011-09-22 LAB — CULTURE, FUNGUS WITHOUT SMEAR

## 2012-01-16 ENCOUNTER — Other Ambulatory Visit: Payer: Self-pay | Admitting: Family Medicine

## 2012-03-12 ENCOUNTER — Other Ambulatory Visit: Payer: Self-pay | Admitting: Family Medicine

## 2012-03-23 ENCOUNTER — Other Ambulatory Visit: Payer: BC Managed Care – PPO

## 2012-03-27 ENCOUNTER — Other Ambulatory Visit (INDEPENDENT_AMBULATORY_CARE_PROVIDER_SITE_OTHER): Payer: BC Managed Care – PPO

## 2012-03-27 DIAGNOSIS — Z0000 Encounter for general adult medical examination without abnormal findings: Secondary | ICD-10-CM

## 2012-03-27 LAB — CBC WITH DIFFERENTIAL/PLATELET
Basophils Relative: 0.6 % (ref 0.0–3.0)
Eosinophils Absolute: 0.2 10*3/uL (ref 0.0–0.7)
Eosinophils Relative: 4.5 % (ref 0.0–5.0)
HCT: 39.8 % (ref 36.0–46.0)
Lymphs Abs: 1.8 10*3/uL (ref 0.7–4.0)
MCHC: 33.6 g/dL (ref 30.0–36.0)
MCV: 86.3 fl (ref 78.0–100.0)
Monocytes Absolute: 0.5 10*3/uL (ref 0.1–1.0)
Neutrophils Relative %: 50.8 % (ref 43.0–77.0)
Platelets: 247 10*3/uL (ref 150.0–400.0)
RBC: 4.61 Mil/uL (ref 3.87–5.11)
WBC: 5.3 10*3/uL (ref 4.5–10.5)

## 2012-03-27 LAB — LIPID PANEL
Cholesterol: 229 mg/dL — ABNORMAL HIGH (ref 0–200)
VLDL: 17.6 mg/dL (ref 0.0–40.0)

## 2012-03-27 LAB — POCT URINALYSIS DIPSTICK
Leukocytes, UA: NEGATIVE
Nitrite, UA: NEGATIVE
Protein, UA: NEGATIVE
Urobilinogen, UA: 0.2
pH, UA: 6

## 2012-03-27 LAB — BASIC METABOLIC PANEL
BUN: 19 mg/dL (ref 6–23)
CO2: 29 mEq/L (ref 19–32)
Chloride: 104 mEq/L (ref 96–112)
Creatinine, Ser: 0.7 mg/dL (ref 0.4–1.2)
Potassium: 4 mEq/L (ref 3.5–5.1)

## 2012-03-27 LAB — HEPATIC FUNCTION PANEL
ALT: 20 U/L (ref 0–35)
Albumin: 4.2 g/dL (ref 3.5–5.2)
Total Protein: 7 g/dL (ref 6.0–8.3)

## 2012-03-27 LAB — TSH: TSH: 0.19 u[IU]/mL — ABNORMAL LOW (ref 0.35–5.50)

## 2012-03-29 ENCOUNTER — Ambulatory Visit (INDEPENDENT_AMBULATORY_CARE_PROVIDER_SITE_OTHER): Payer: BC Managed Care – PPO | Admitting: Family Medicine

## 2012-03-29 ENCOUNTER — Encounter: Payer: Self-pay | Admitting: Family Medicine

## 2012-03-29 VITALS — BP 124/78 | HR 72 | Temp 97.8°F | Resp 12 | Ht <= 58 in | Wt 118.0 lb

## 2012-03-29 DIAGNOSIS — Z0000 Encounter for general adult medical examination without abnormal findings: Secondary | ICD-10-CM

## 2012-03-29 DIAGNOSIS — L821 Other seborrheic keratosis: Secondary | ICD-10-CM

## 2012-03-29 NOTE — Patient Instructions (Addendum)
Set up mammogram for this year Followup with dermatologist if right facial lesion is not fully resolving with liquid nitrogen

## 2012-03-29 NOTE — Progress Notes (Signed)
Subjective:    Patient ID: Sophia Russell, female    DOB: [birth year], 52 y.o.   MRN: [id number]  HPI Here for complete physical. She has history of hypertension, hyperlipidemia, and hypothyroidism. Generally feels well. Exercises fairly regularly. Tetanus up-to-date. Pap smear last are normal. She plans to get the mammogram this year.  Past history of what sounds like seborrheic keratosis right face. Treated previously with liquid nitrogen (per derm) but with recurrence. Lesion is scaly. No family history or personal history of skin cancer.  Patient nonsmoker. No regular alcohol use.  Past Medical History  Diagnosis Date  . DEPRESSION 01/15/2009  . HYPERLIPIDEMIA 01/15/2009  . HYPERTENSION 01/15/2009  . HYPOTHYROIDISM 01/15/2009   Past Surgical History  Procedure Laterality Date  . Cesarean section      x2  . Foot surgery      x2    reports that she has never smoked. She does not have any smokeless tobacco history on file. Her alcohol and drug histories are not on file. family history includes Hyperlipidemia in an unspecified family member and Hypertension in an unspecified family member. She is adopted. Allergies  Allergen Reactions  . Erythromycin Base     REACTION: severe stomach cramps  . Ferrous Sulfate     REACTION: severe stomach cramps  . Sulfonamide Derivatives       Review of Systems  Constitutional: Negative for fever, activity change, appetite change, fatigue and unexpected weight change.  HENT: Negative for hearing loss, ear pain, sore throat and trouble swallowing.   Eyes: Negative for visual disturbance.  Respiratory: Negative for cough and shortness of breath.   Cardiovascular: Negative for chest pain and palpitations.  Gastrointestinal: Negative for abdominal pain, diarrhea, constipation and blood in stool.  Genitourinary: Negative for dysuria and hematuria.  Musculoskeletal: Negative for myalgias, back pain and arthralgias.  Skin: Negative for rash.   Neurological: Negative for dizziness, syncope and headaches.  Hematological: Negative for adenopathy.  Psychiatric/Behavioral: Negative for confusion and dysphoric mood.       Objective:   Physical Exam  Constitutional: She is oriented to person, place, and time. She appears well-developed and well-nourished.  HENT:  Head: Normocephalic and atraumatic.  Eyes: EOM are normal. Pupils are equal, round, and reactive to light.  Neck: Normal range of motion. Neck supple. No thyromegaly present.  Cardiovascular: Normal rate, regular rhythm and normal heart sounds.   No murmur heard. Pulmonary/Chest: Breath sounds normal. No respiratory distress. She has no wheezes. She has no rales.  Abdominal: Soft. Bowel sounds are normal. She exhibits no distension and no mass. There is no tenderness. There is no rebound and no guarding.  Genitourinary:  Breasts symmetric with no mass.  Musculoskeletal: Normal range of motion. She exhibits no edema.  Lymphadenopathy:    She has no cervical adenopathy.  Neurological: She is alert and oriented to person, place, and time. She displays normal reflexes. No cranial nerve deficit.  Skin: No rash noted.  Right face reveals slightly scaly well-demarcated light brown homogenous color area approximately 6 x 8 mm  Psychiatric: She has a normal mood and affect. Her behavior is normal. Judgment and thought content normal.          Assessment & Plan:  Health maintenance. Labs reviewed with patient and favorable. Continue yearly mammogram. Repeat Pap smear done next year.  Probable seborrheic keratosis right face. Discussed risk and benefits of treatment with liquid nitrogen and patient consents. Treated without difficulty. She will f/u with dermatology if this  is not fully resolving next couple of weeks.

## 2012-04-23 ENCOUNTER — Encounter: Payer: Self-pay | Admitting: Family Medicine

## 2012-04-23 ENCOUNTER — Ambulatory Visit (INDEPENDENT_AMBULATORY_CARE_PROVIDER_SITE_OTHER): Payer: BC Managed Care – PPO | Admitting: Family Medicine

## 2012-04-23 ENCOUNTER — Ambulatory Visit (INDEPENDENT_AMBULATORY_CARE_PROVIDER_SITE_OTHER)
Admission: RE | Admit: 2012-04-23 | Discharge: 2012-04-23 | Disposition: A | Discharge status: Home / Self Care | Payer: BC Managed Care – PPO | Source: Ambulatory Visit | Attending: Family Medicine | Admitting: Family Medicine

## 2012-04-23 VITALS — BP 140/90 | Temp 98.0°F

## 2012-04-23 DIAGNOSIS — M79609 Pain in unspecified limb: Secondary | ICD-10-CM

## 2012-04-23 DIAGNOSIS — M19049 Primary osteoarthritis, unspecified hand: Secondary | ICD-10-CM

## 2012-04-23 DIAGNOSIS — M7710 Lateral epicondylitis, unspecified elbow: Secondary | ICD-10-CM

## 2012-04-23 DIAGNOSIS — M7711 Lateral epicondylitis, right elbow: Secondary | ICD-10-CM

## 2012-04-23 DIAGNOSIS — M79645 Pain in left finger(s): Secondary | ICD-10-CM

## 2012-04-23 DIAGNOSIS — M1812 Unilateral primary osteoarthritis of first carpometacarpal joint, left hand: Secondary | ICD-10-CM | POA: Insufficient documentation

## 2012-04-23 NOTE — Progress Notes (Signed)
  Subjective:    Patient ID: Sophia Russell, female    DOB: December 26, 1960, 52 y.o.   MRN: [id number]  HPI Patient seen with left thumb pain Onset yesterday. No injury. Location is carpometacarpal joint. No erythema. No warmth. Right-hand dominant. Pain is sharp to achy and worse with movement. No history of similar problem in past No significant wrist pain.  Also complains of recurrent right lateral elbow pain. Does a lot of typing. No injury. No warmth or erythema. No ecchymosis. No improvement with icing. She's also tried tennis elbow strap without improvement. Previous injection couple of years ago helped   Review of Systems  Constitutional: Negative for fever and chills.  HENT: Negative for neck pain.   Neurological: Negative for weakness and numbness.       Objective:   Physical Exam  Constitutional: She appears well-developed and well-nourished.  Cardiovascular: Normal rate and regular rhythm.   Pulmonary/Chest: Effort normal and breath sounds normal. No respiratory distress. She has no wheezes. She has no rales.  Musculoskeletal:  Left thumb reveals tenderness carpal metacarpal joint. No erythema or warmth. No ecchymosis. She has some mild hypertrophic type changes involving this joint.  Right elbow reveals full range of motion. Tenderness lateral epicondylar region. No pain with supination/ pronation.          Assessment & Plan:  #1 left thumb pain. 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 Obtain x-rays. She will try naproxen which she has at home #2 right lateral epicondylitis. Not improved with conservative therapies. Discussed risk and benefits of steroid injection and patient consented.  Right elbow prepped with Betadine. Using 5/8 25-gauge needle injected 20 mg Depo-Medrol and 1 cc of plain Xylocaine without difficulty. She will look at changing to a different mouse with typing.

## 2012-04-23 NOTE — Patient Instructions (Addendum)
Tennis Elbow  Your caregiver has diagnosed you with a condition often referred to as "tennis elbow." This results from small tears or soreness (inflammation) at the start (origin) of the extensor muscles of the forearm. Although the condition is often called tennis or golfer's elbow, it is caused by any repetitive action performed by your elbow.  HOME CARE INSTRUCTIONS   If the condition has been short lived, rest may be the only treatment required. Using your opposite hand or arm to perform the task may help. Even changing your grip may help rest the extremity. These may even prevent the condition from recurring.   Longer standing problems, however, will often be relieved faster by:   Using anti-inflammatory agents.   Applying ice packs for 30 minutes at the end of the working day, at bed time, or when activities are finished.   Your caregiver may also have you wear a splint or sling. This will allow the inflamed tendon to heal.  At times, steroid injections aided with a local anesthetic will be required along with splinting for 1 to 2 weeks. Two to three steroid injections will often solve the problem. In some long standing cases, the inflamed tendon does not respond to conservative (non-surgical) therapy. Then surgery may be required to repair it.  MAKE SURE YOU:    Understand these instructions.   Will watch your condition.   Will get help right away if you are not doing well or get worse.  Document Released: 01/24/2005 Document Revised: 04/18/2011 Document Reviewed: 09/12/2007  [employer] Patient Information 2013 [employer].

## 2012-04-24 NOTE — Progress Notes (Signed)
Quick Note:  Pt informed. She referred to Dr Lucie Leather message and thought he wanted to discuss in greater detail? She was sorry she missed your call. ______

## 2012-04-25 NOTE — Progress Notes (Signed)
Quick Note:  Pt informed ______ 

## 2012-05-10 ENCOUNTER — Other Ambulatory Visit: Payer: Self-pay | Admitting: Family Medicine

## 2012-05-21 ENCOUNTER — Other Ambulatory Visit: Payer: Self-pay | Admitting: Family Medicine

## 2012-05-22 NOTE — Telephone Encounter (Signed)
Refill both for one year. 

## 2013-03-25 ENCOUNTER — Other Ambulatory Visit (INDEPENDENT_AMBULATORY_CARE_PROVIDER_SITE_OTHER): Payer: BC Managed Care – PPO

## 2013-03-25 DIAGNOSIS — Z0000 Encounter for general adult medical examination without abnormal findings: Secondary | ICD-10-CM

## 2013-03-25 LAB — HEPATIC FUNCTION PANEL
ALK PHOS: 50 U/L (ref 39–117)
ALT: 22 U/L (ref 0–35)
AST: 21 U/L (ref 0–37)
Albumin: 4.2 g/dL (ref 3.5–5.2)
BILIRUBIN DIRECT: 0 mg/dL (ref 0.0–0.3)
TOTAL PROTEIN: 6.6 g/dL (ref 6.0–8.3)
Total Bilirubin: 0.5 mg/dL (ref 0.3–1.2)

## 2013-03-25 LAB — CBC WITH DIFFERENTIAL/PLATELET
BASOS ABS: 0 10*3/uL (ref 0.0–0.1)
Basophils Relative: 0.8 % (ref 0.0–3.0)
EOS ABS: 0.1 10*3/uL (ref 0.0–0.7)
Eosinophils Relative: 2.8 % (ref 0.0–5.0)
HEMATOCRIT: 41.9 % (ref 36.0–46.0)
HEMOGLOBIN: 13.6 g/dL (ref 12.0–15.0)
LYMPHS ABS: 1.8 10*3/uL (ref 0.7–4.0)
Lymphocytes Relative: 42 % (ref 12.0–46.0)
MCHC: 32.5 g/dL (ref 30.0–36.0)
MCV: 89.6 fl (ref 78.0–100.0)
Monocytes Absolute: 0.3 10*3/uL (ref 0.1–1.0)
Monocytes Relative: 7.9 % (ref 3.0–12.0)
NEUTROS ABS: 2 10*3/uL (ref 1.4–7.7)
Neutrophils Relative %: 46.5 % (ref 43.0–77.0)
Platelets: 242 10*3/uL (ref 150.0–400.0)
RBC: 4.68 Mil/uL (ref 3.87–5.11)
RDW: 15.1 % — AB (ref 11.5–14.6)
WBC: 4.4 10*3/uL — ABNORMAL LOW (ref 4.5–10.5)

## 2013-03-25 LAB — BASIC METABOLIC PANEL
BUN: 12 mg/dL (ref 6–23)
CO2: 28 meq/L (ref 19–32)
Calcium: 9.5 mg/dL (ref 8.4–10.5)
Chloride: 105 mEq/L (ref 96–112)
Creatinine, Ser: 0.6 mg/dL (ref 0.4–1.2)
GFR: 112.26 mL/min (ref >60.00)
GLUCOSE: 88 mg/dL (ref 70–99)
POTASSIUM: 4.1 meq/L (ref 3.5–5.1)
SODIUM: 141 meq/L (ref 135–145)

## 2013-03-25 LAB — POCT URINALYSIS DIPSTICK
Bilirubin, UA: NEGATIVE
Blood, UA: NEGATIVE
GLUCOSE UA: NEGATIVE
KETONES UA: NEGATIVE
LEUKOCYTES UA: NEGATIVE
Nitrite, UA: NEGATIVE
Protein, UA: NEGATIVE
SPEC GRAV UA: 1.02
UROBILINOGEN UA: 0.2
pH, UA: 6.5

## 2013-03-25 LAB — LDL CHOLESTEROL, DIRECT: Direct LDL: 120.1 mg/dL

## 2013-03-25 LAB — LIPID PANEL
Cholesterol: 202 mg/dL — ABNORMAL HIGH (ref 0–200)
HDL: 71.1 mg/dL (ref >39.00)
Total CHOL/HDL Ratio: 3
Triglycerides: 45 mg/dL (ref 0.0–149.0)
VLDL: 9 mg/dL (ref 0.0–40.0)

## 2013-03-25 LAB — TSH: TSH: 0.17 u[IU]/mL — ABNORMAL LOW (ref 0.35–5.50)

## 2013-04-01 ENCOUNTER — Other Ambulatory Visit (HOSPITAL_COMMUNITY)
Admission: RE | Admit: 2013-04-01 | Discharge: 2013-04-01 | Disposition: A | Discharge status: Home / Self Care | Payer: BC Managed Care – PPO | Source: Ambulatory Visit | Attending: Family Medicine | Admitting: Family Medicine

## 2013-04-01 ENCOUNTER — Ambulatory Visit (INDEPENDENT_AMBULATORY_CARE_PROVIDER_SITE_OTHER): Payer: BC Managed Care – PPO | Admitting: Family Medicine

## 2013-04-01 ENCOUNTER — Encounter: Payer: Self-pay | Admitting: Family Medicine

## 2013-04-01 VITALS — BP 128/80 | HR 73 | Temp 98.2°F | Ht <= 58 in | Wt 110.0 lb

## 2013-04-01 DIAGNOSIS — E039 Hypothyroidism, unspecified: Secondary | ICD-10-CM

## 2013-04-01 DIAGNOSIS — M19049 Primary osteoarthritis, unspecified hand: Secondary | ICD-10-CM

## 2013-04-01 DIAGNOSIS — Z01419 Encounter for gynecological examination (general) (routine) without abnormal findings: Secondary | ICD-10-CM | POA: Insufficient documentation

## 2013-04-01 DIAGNOSIS — Z0000 Encounter for general adult medical examination without abnormal findings: Secondary | ICD-10-CM

## 2013-04-01 DIAGNOSIS — M1812 Unilateral primary osteoarthritis of first carpometacarpal joint, left hand: Secondary | ICD-10-CM

## 2013-04-01 MED ORDER — LEVOTHYROXINE SODIUM 75 MCG PO TABS: 75.0000 ug | ORAL_TABLET | Freq: Every day | ORAL | Status: DC

## 2013-04-01 NOTE — Progress Notes (Signed)
Pre visit review using our clinic review tool, if applicable. No additional management support is needed unless otherwise documented below in the visit note. 

## 2013-04-01 NOTE — Patient Instructions (Addendum)
Schedule repeat mammogram We will call you regarding screening colonoscopy Will call you regarding orthopedic referral Reduce levothyroxin 75 mcg daily and repeat thyroid function in 3 months Try reducing losartan to 25 mg once daily-monitor blood pressures and be in touch if consistently greater than 140/90

## 2013-04-01 NOTE — Progress Notes (Signed)
Subjective:    Patient ID: Sophia Russell, female    DOB: 1960-09-07, 53 y.o.   MRN: [id number]  HPI Here for complete physical and to discuss a couple medical problems as below. She has hypertension treated with losartan. She has hypothyroidism levothyroxin 88 mcg daily. She has complaints of left thumb pain. She states saw orthopedist last year and had x-rays which confirmed osteoarthritis involving the [facility] joint and MCP joint. She turned 52 this year. No history of screening colonoscopy. Last mammogram 2 years ago. Last Pap smear 2 years ago. She exercises regularly. Nonsmoker. Tetanus up-to-date.  She is adopted so family history is unknown  She expresses frustration with difficulty losing weight. She's also some generalized alopecia and feels her hands and feet are cold frequently. She is concerned about hypothyroidism. Takes her levothyroxin regularly and is actually slightly over replaced by recent labs  Past Medical History  Diagnosis Date  . DEPRESSION 01/15/2009  . HYPERLIPIDEMIA 01/15/2009  . HYPERTENSION 01/15/2009  . HYPOTHYROIDISM 01/15/2009   Past Surgical History  Procedure Laterality Date  . Cesarean section      x2  . Foot surgery      x2    reports that she has never smoked. She does not have any smokeless tobacco history on file. Her alcohol and drug histories are not on file. family history includes Hyperlipidemia in an other family member; Hypertension in an other family member. She was adopted. Allergies  Allergen Reactions  . Erythromycin Base     REACTION: severe stomach cramps  . Ferrous Sulfate     REACTION: severe stomach cramps  . Sulfonamide Derivatives       Review of Systems  Constitutional: Negative for fever, activity change, appetite change, fatigue and unexpected weight change.  HENT: Negative for ear pain, hearing loss, sore throat and trouble swallowing.   Eyes: Negative for visual disturbance.  Respiratory: Negative for cough and  shortness of breath.   Cardiovascular: Negative for chest pain and palpitations.  Gastrointestinal: Negative for abdominal pain, diarrhea, constipation and blood in stool.  Genitourinary: Negative for dysuria and hematuria.  Musculoskeletal: Positive for arthralgias. Negative for back pain and myalgias.  Skin: Negative for rash.  Neurological: Negative for dizziness, syncope and headaches.  Hematological: Negative for adenopathy.  Psychiatric/Behavioral: Negative for confusion and dysphoric mood.       Objective:   Physical Exam  Constitutional: She is oriented to person, place, and time. She appears well-developed and well-nourished.  HENT:  Head: Normocephalic and atraumatic.  Eyes: EOM are normal. Pupils are equal, round, and reactive to light.  Neck: Normal range of motion. Neck supple. No thyromegaly present.  Cardiovascular: Normal rate, regular rhythm and normal heart sounds.   No murmur heard. Pulmonary/Chest: Breath sounds normal. No respiratory distress. She has no wheezes. She has no rales.  Abdominal: Soft. Bowel sounds are normal. She exhibits no distension and no mass. There is no tenderness. There is no rebound and no guarding.  Genitourinary:  Breasts are symmetric with no mass. Pelvic exam reveals normal external genitalia. Vaginal mucosa and cervix are normal in appearance. Pap smear obtained. Bimanual exam no adnexal tenderness and no mass is noted  Musculoskeletal: Normal range of motion. She exhibits no edema.  Lymphadenopathy:    She has no cervical adenopathy.  Neurological: She is alert and oriented to person, place, and time. She displays normal reflexes. No cranial nerve deficit.  Skin: No rash noted.  Psychiatric: She has a normal mood  and affect. Her behavior is normal. Judgment and thought content normal.          Assessment & Plan:  Complete physical. Obtain Pap smear. Schedule screening colonoscopy. Patient will schedule repeat mammogram. Labs  reviewed with no major concerns.  Hypothyroidism over replaced. Reduce levothyroxin 75 mcg daily and recheck TSH in 3 months  Osteoarthritis left thumb. Set up referral to hand specialist.

## 2013-04-02 ENCOUNTER — Encounter: Payer: Self-pay | Admitting: Internal Medicine

## 2013-04-05 ENCOUNTER — Other Ambulatory Visit: Payer: Self-pay

## 2013-04-05 DIAGNOSIS — Z1231 Encounter for screening mammogram for malignant neoplasm of breast: Secondary | ICD-10-CM

## 2013-04-12 ENCOUNTER — Other Ambulatory Visit: Payer: Self-pay | Admitting: Family Medicine

## 2013-04-16 ENCOUNTER — Telehealth: Payer: Self-pay | Admitting: Family Medicine

## 2013-04-16 NOTE — Telephone Encounter (Signed)
Pt is not sure what  dose of levothyroxine she should be on. Please clarify and call pt

## 2013-04-16 NOTE — Telephone Encounter (Signed)
Left message informing patient to take the 71mcg only

## 2013-04-20 ENCOUNTER — Other Ambulatory Visit: Payer: Self-pay | Admitting: Family Medicine

## 2013-04-23 ENCOUNTER — Ambulatory Visit
Admission: RE | Admit: 2013-04-23 | Discharge: 2013-04-23 | Disposition: A | Discharge status: Home / Self Care | Payer: BC Managed Care – PPO | Source: Ambulatory Visit

## 2013-04-23 DIAGNOSIS — Z1231 Encounter for screening mammogram for malignant neoplasm of breast: Secondary | ICD-10-CM

## 2013-04-25 ENCOUNTER — Other Ambulatory Visit: Payer: Self-pay | Admitting: Orthopedic Surgery

## 2013-04-25 ENCOUNTER — Ambulatory Visit (INDEPENDENT_AMBULATORY_CARE_PROVIDER_SITE_OTHER): Payer: BC Managed Care – PPO

## 2013-04-25 DIAGNOSIS — R52 Pain, unspecified: Secondary | ICD-10-CM

## 2013-04-25 DIAGNOSIS — M79609 Pain in unspecified limb: Secondary | ICD-10-CM

## 2013-05-16 ENCOUNTER — Ambulatory Visit (AMBULATORY_SURGERY_CENTER): Payer: Self-pay | Admitting: *Deleted

## 2013-05-16 ENCOUNTER — Encounter: Payer: Self-pay | Admitting: Internal Medicine

## 2013-05-16 VITALS — Ht <= 58 in | Wt 112.4 lb

## 2013-05-16 DIAGNOSIS — Z1211 Encounter for screening for malignant neoplasm of colon: Secondary | ICD-10-CM

## 2013-05-16 MED ORDER — MOVIPREP 100 G PO SOLR: ORAL | Status: DC

## 2013-05-16 NOTE — Progress Notes (Signed)
No allergies to eggs or soy. No problems with anesthesia.  Pt given Emmi instructions for colonoscopy  

## 2013-05-22 ENCOUNTER — Telehealth: Payer: Self-pay | Admitting: Family Medicine

## 2013-05-22 MED ORDER — LEVOTHYROXINE SODIUM 75 MCG PO TABS: 75.0000 ug | ORAL_TABLET | Freq: Every day | ORAL | Status: DC

## 2013-05-22 NOTE — Telephone Encounter (Signed)
[facility]

## 2013-05-22 NOTE — Telephone Encounter (Signed)
Pt is needing a new rx for levothyroxine (SYNTHROID, LEVOTHROID) 75 MCG tablet, pt states dr. Elease Hashimoto was lowering the dosage, send to [facility].

## 2013-05-30 ENCOUNTER — Encounter: Payer: BC Managed Care – PPO | Admitting: Internal Medicine

## 2013-06-07 ENCOUNTER — Telehealth: Payer: Self-pay | Admitting: Family Medicine

## 2013-06-07 MED ORDER — LEVOTHYROXINE SODIUM 75 MCG PO TABS: 75.0000 ug | ORAL_TABLET | Freq: Every day | ORAL | Status: DC

## 2013-06-07 NOTE — Telephone Encounter (Signed)
[facility]

## 2013-06-07 NOTE — Telephone Encounter (Signed)
Pt is needing new rx for levothyroxine (SYNTHROID, LEVOTHROID) 75 MCG tablet, pt states that [facility] is stating they never received the rx that was electronically sent to them. Pt has been out of her meds for 4 days. Pt states dr. Elease Hashimoto had lowered her dosage. Pt is requesting to get a 14 day supply sent to [facility] and the rest to [facility].

## 2013-06-10 ENCOUNTER — Telehealth: Payer: Self-pay | Admitting: Family Medicine

## 2013-06-10 NOTE — Telephone Encounter (Signed)
[facility] and [facility] and wants [place] to call them  The ref no. [id number]

## 2013-06-10 NOTE — Telephone Encounter (Signed)
[facility] is needing verification of the pt's rx levothyroxine (SYNTHROID, LEVOTHROID) 75 MCG tablet, pharmacy is showing a different last name for the pt, need to verify the dosage of the rx.

## 2013-06-11 NOTE — Telephone Encounter (Signed)
[facility]oke [facility] and they are informed that pt name has changed.

## 2013-06-28 ENCOUNTER — Other Ambulatory Visit (INDEPENDENT_AMBULATORY_CARE_PROVIDER_SITE_OTHER): Payer: BC Managed Care – PPO

## 2013-06-28 DIAGNOSIS — E039 Hypothyroidism, unspecified: Secondary | ICD-10-CM

## 2013-06-28 LAB — TSH: TSH: 0.19 u[IU]/mL — AB (ref 0.35–4.50)

## 2013-07-02 ENCOUNTER — Other Ambulatory Visit: Payer: Self-pay | Admitting: Family Medicine

## 2013-07-02 ENCOUNTER — Other Ambulatory Visit: Payer: Self-pay

## 2013-07-02 DIAGNOSIS — E039 Hypothyroidism, unspecified: Secondary | ICD-10-CM

## 2013-07-02 MED ORDER — LEVOTHYROXINE SODIUM 50 MCG PO TABS: 50.0000 ug | ORAL_TABLET | Freq: Every day | ORAL | Status: DC

## 2013-09-12 ENCOUNTER — Other Ambulatory Visit: Payer: Self-pay | Admitting: Family Medicine

## 2013-10-18 ENCOUNTER — Encounter: Payer: Self-pay | Admitting: Family Medicine

## 2013-10-18 ENCOUNTER — Ambulatory Visit (INDEPENDENT_AMBULATORY_CARE_PROVIDER_SITE_OTHER): Payer: BC Managed Care – PPO | Admitting: Family Medicine

## 2013-10-18 VITALS — BP 126/74 | HR 71 | Temp 98.3°F | Wt 112.0 lb

## 2013-10-18 DIAGNOSIS — Z23 Encounter for immunization: Secondary | ICD-10-CM

## 2013-10-18 DIAGNOSIS — G4700 Insomnia, unspecified: Secondary | ICD-10-CM

## 2013-10-18 MED ORDER — ZOLPIDEM TARTRATE 10 MG PO TABS: 10.0000 mg | ORAL_TABLET | Freq: Every evening | ORAL | Status: DC | PRN

## 2013-10-18 NOTE — Patient Instructions (Signed)
Insomnia Insomnia is frequent trouble falling and/or staying asleep. Insomnia can be a long term problem or a short term problem. Both are common. Insomnia can be a short term problem when the wakefulness is related to a certain stress or worry. Long term insomnia is often related to ongoing stress during waking hours and/or poor sleeping habits. Overtime, sleep deprivation itself can make the problem worse. Every little thing feels more severe because you are overtired and your ability to cope is decreased. CAUSES   Stress, anxiety, and depression.  Poor sleeping habits.  Distractions such as TV in the bedroom.  Naps close to bedtime.  Engaging in emotionally charged conversations before bed.  Technical reading before sleep.  Alcohol and other sedatives. They may make the problem worse. They can hurt normal sleep patterns and normal dream activity.  Stimulants such as caffeine for several hours prior to bedtime.  Pain syndromes and shortness of breath can cause insomnia.  Exercise late at night.  Changing time zones may cause sleeping problems (jet lag). It is sometimes helpful to have someone observe your sleeping patterns. They should look for periods of not breathing during the night (sleep apnea). They should also look to see how long those periods last. If you live alone or observers are uncertain, you can also be observed at a sleep clinic where your sleep patterns will be professionally monitored. Sleep apnea requires a checkup and treatment. Give your caregivers your medical history. Give your caregivers observations your family has made about your sleep.  SYMPTOMS   Not feeling rested in the morning.  Anxiety and restlessness at bedtime.  Difficulty falling and staying asleep. TREATMENT   Your caregiver may prescribe treatment for an underlying medical disorders. Your caregiver can give advice or help if you are using alcohol or other drugs for self-medication. Treatment  of underlying problems will usually eliminate insomnia problems.  Medications can be prescribed for short time use. They are generally not recommended for lengthy use.  Over-the-counter sleep medicines are not recommended for lengthy use. They can be habit forming.  You can promote easier sleeping by making lifestyle changes such as:  Using relaxation techniques that help with breathing and reduce muscle tension.  Exercising earlier in the day.  Changing your diet and the time of your last meal. No night time snacks.  Establish a regular time to go to bed.  Counseling can help with stressful problems and worry.  Soothing music and white noise may be helpful if there are background noises you cannot remove.  Stop tedious detailed work at least one hour before bedtime. HOME CARE INSTRUCTIONS   Keep a diary. Inform your caregiver about your progress. This includes any medication side effects. See your caregiver regularly. Take note of:  Times when you are asleep.  Times when you are awake during the night.  The quality of your sleep.  How you feel the next day. This information will help your caregiver care for you.  Get out of bed if you are still awake after 15 minutes. Read or do some quiet activity. Keep the lights down. Wait until you feel sleepy and go back to bed.  Keep regular sleeping and waking hours. Avoid naps.  Exercise regularly.  Avoid distractions at bedtime. Distractions include watching television or engaging in any intense or detailed activity like attempting to balance the household checkbook.  Develop a bedtime ritual. Keep a familiar routine of bathing, brushing your teeth, climbing into bed at the same   time each night, listening to soothing music. Routines increase the success of falling to sleep faster.  Use relaxation techniques. This can be using breathing and muscle tension release routines. It can also include visualizing peaceful scenes. You can  also help control troubling or intruding thoughts by keeping your mind occupied with boring or repetitive thoughts like the old concept of counting sheep. You can make it more creative like imagining planting one beautiful flower after another in your backyard garden.  During your day, work to eliminate stress. When this is not possible use some of the previous suggestions to help reduce the anxiety that accompanies stressful situations. MAKE SURE YOU:   Understand these instructions.  Will watch your condition.  Will get help right away if you are not doing well or get worse. Document Released: 01/22/2000 Document Revised: 04/18/2011 Document Reviewed: 02/21/2007 [employer] Patient Information 2015 [employer]. This information is not intended to replace advice given to you by your health care provider. Make sure you discuss any questions you have with your health care provider.  

## 2013-10-18 NOTE — Progress Notes (Signed)
   Subjective:    Patient ID: Sophia Russell, female    DOB: 01/14/1961, 53 y.o.   MRN: [id number]  HPI  Discuss insomnia issues. She generally falls asleep but has difficulty staying asleep. Frequently after one or 2 hours wakes up and cannot get back to sleep for several hours. Has tried melatonin as well as Tylenol PM without much improvement. No caffeine use late in the day. No alcohol use. She had hangover drowsiness also with Benadryl. She also tried another type of over-the-counter sleep aid which she cannot recall the name of which caused hangover drowsiness. Denies depressive symptoms. No specific stressors.  Past Medical History  Diagnosis Date  . DEPRESSION 01/15/2009  . HYPERLIPIDEMIA 01/15/2009  . HYPERTENSION 01/15/2009  . HYPOTHYROIDISM 01/15/2009  . Arthritis    Past Surgical History  Procedure Laterality Date  . Cesarean section  1994, 1996    x2  . Foot surgery Right 2009    x3  . Incisional hernia repair  1996    reports that she has never smoked. She has never used smokeless tobacco. She reports that she drinks about 1.2 ounces of alcohol per week. She reports that she does not use illicit drugs. family history includes Hyperlipidemia in an other family member; Hypertension in an other family member. She was adopted. Allergies  Allergen Reactions  . Erythromycin Base Nausea And Vomiting    REACTION: severe stomach cramps  . Ferrous Sulfate     REACTION: severe stomach cramps  . Sulfonamide Derivatives Nausea And Vomiting    Stomach cramps     Review of Systems  Constitutional: Negative for appetite change and unexpected weight change.  Psychiatric/Behavioral: Positive for sleep disturbance. Negative for dysphoric mood and agitation. The patient is not nervous/anxious.        Objective:   Physical Exam  Constitutional: She appears well-developed and well-nourished.  Cardiovascular: Normal rate and regular rhythm.   Pulmonary/Chest: Effort normal  and breath sounds normal. No respiratory distress. She has no wheezes. She has no rales.  Musculoskeletal: She exhibits no edema.  Psychiatric: She has a normal mood and affect. Her behavior is normal.          Assessment & Plan:  Insomnia. Sleep hygiene discussed. Short-term only use of Ambien 10 mg one half to one tablet each bedtime when necessary.

## 2013-10-18 NOTE — Progress Notes (Signed)
Pre visit review using our clinic review tool, if applicable. No additional management support is needed unless otherwise documented below in the visit note. 

## 2013-10-21 ENCOUNTER — Encounter: Payer: Self-pay | Admitting: Family Medicine

## 2014-01-27 ENCOUNTER — Telehealth: Payer: Self-pay | Admitting: Family Medicine

## 2014-01-27 MED ORDER — VENLAFAXINE HCL ER 75 MG PO CP24
75.0000 mg | ORAL_CAPSULE | Freq: Every day | ORAL | Status: DC
Start: 2014-01-27 — End: 2014-04-04

## 2014-01-27 NOTE — Telephone Encounter (Signed)
PLEASE NOTE: All timestamps contained within this report are represented as [place] Standard Time. CONFIDENTIALTY NOTICE: This fax transmission is intended only for the addressee. It contains information that is legally privileged, confidential or otherwise protected from use or disclosure. If you are not the intended recipient, you are strictly prohibited from reviewing, disclosing, copying using or disseminating any of this information or taking any action in reliance on or regarding this information. If you have received this fax in error, please notify us immediately by telephone so that we can arrange for its return to [place]. Phone: [phone], Toll-Free: [phone], Fax: [phone] Page: 1 of 1 Call Id: [id number] [facility] [place] Patient Name: Sophia Russell Gender: Female DOB: 07/20/1960 Age: 53 Y 42 M 18 D Return Phone Number: [phone] (Primary) Address: [place] City/State/Zip: [place] [address] Client [facility] - Client Client [facility] - Day Physician Carolann Littler Contact Type Call Call Type Triage / Clinical Relationship To Patient Self Return Phone Number [phone] (Primary) Chief Complaint Medication Question (non symptomatic) Initial Comment Caller states she has forgotten her medication at home while she has been on vacation and she is out of it. What can she do when she gets home? Nurse Assessment Nurse: Markus Daft, RN, Sherre Poot Date/Time Eilene Ghazi Time): 01/27/2014 11:02:16 AM Confirm and document reason for call. If symptomatic, describe symptoms. ---Caller states she has forgotten her medication at home while she has been on vacation, Effexor XR 150 mg QHS, and going home today. She is having aches/insomnia/irritability, crying spells/vivid dreams. She has been without it 4 days. She is functioning ok right  now. What can she do when she gets home (5 hr drive and with others)? Has the patient traveled out of the country within the last 30 days? ---Not Applicable Does the patient require triage? ---Declined Triage Please document clinical information provided and list any resource used. ---RN advised that she resume the dose today, and if felt that it is not enough then to call to office to ask MD while office is open. Caller verb. understanding. Guidelines Guideline Title Affirmed Question Affirmed Notes Nurse Date/Time (Eastern Time) Disp. Time Eilene Ghazi Time) Disposition Final User 01/27/2014 10:32:18 AM Attempt made - message left Jackqulyn Livings 01/27/2014 11:05:47 AM Clinical Call Yes Markus Daft, RN, Sherre Poot After Care Instructions Given Call Event Type User Date / Time Description

## 2014-01-27 NOTE — Telephone Encounter (Signed)
RX sent to pharmacy. Pt is aware.

## 2014-01-27 NOTE — Telephone Encounter (Signed)
Start back Effexor XR 75 mg one daily for one week and then resume 150 mg dose.

## 2014-03-24 ENCOUNTER — Telehealth: Payer: Self-pay

## 2014-03-24 MED ORDER — VENLAFAXINE HCL ER 150 MG PO CP24: 150.0000 mg | ORAL_CAPSULE | Freq: Every day | ORAL | Status: DC

## 2014-03-24 NOTE — Telephone Encounter (Signed)
Rx sent to pharmacy   

## 2014-03-24 NOTE — Telephone Encounter (Signed)
[facility] refill request for VENLAFAXINE 150MG  #90

## 2014-03-28 ENCOUNTER — Other Ambulatory Visit (INDEPENDENT_AMBULATORY_CARE_PROVIDER_SITE_OTHER): Payer: BC Managed Care – PPO

## 2014-03-28 ENCOUNTER — Other Ambulatory Visit: Payer: BC Managed Care – PPO

## 2014-03-28 DIAGNOSIS — Z0000 Encounter for general adult medical examination without abnormal findings: Secondary | ICD-10-CM

## 2014-03-28 DIAGNOSIS — E039 Hypothyroidism, unspecified: Secondary | ICD-10-CM

## 2014-03-28 LAB — CBC WITH DIFFERENTIAL/PLATELET
BASOS ABS: 0.1 10*3/uL (ref 0.0–0.1)
Basophils Relative: 0.9 % (ref 0.0–3.0)
Eosinophils Absolute: 0.1 10*3/uL (ref 0.0–0.7)
Eosinophils Relative: 2.4 % (ref 0.0–5.0)
HEMATOCRIT: 40.9 % (ref 36.0–46.0)
HEMOGLOBIN: 14 g/dL (ref 12.0–15.0)
LYMPHS ABS: 2 10*3/uL (ref 0.7–4.0)
Lymphocytes Relative: 33.2 % (ref 12.0–46.0)
MCHC: 34.2 g/dL (ref 30.0–36.0)
MCV: 85.7 fl (ref 78.0–100.0)
Monocytes Absolute: 0.5 10*3/uL (ref 0.1–1.0)
Monocytes Relative: 8 % (ref 3.0–12.0)
NEUTROS ABS: 3.3 10*3/uL (ref 1.4–7.7)
Neutrophils Relative %: 55.5 % (ref 43.0–77.0)
Platelets: 274 10*3/uL (ref 150.0–400.0)
RBC: 4.78 Mil/uL (ref 3.87–5.11)
RDW: 14.4 % (ref 11.5–15.5)
WBC: 5.9 10*3/uL (ref 4.0–10.5)

## 2014-03-28 LAB — BASIC METABOLIC PANEL
BUN: 9 mg/dL (ref 6–23)
CO2: 31 meq/L (ref 19–32)
Calcium: 10.2 mg/dL (ref 8.4–10.5)
Chloride: 99 mEq/L (ref 96–112)
Creatinine, Ser: 0.58 mg/dL (ref 0.40–1.20)
GFR: 116.28 mL/min (ref >60.00)
Glucose, Bld: 92 mg/dL (ref 70–99)
POTASSIUM: 4.7 meq/L (ref 3.5–5.1)
SODIUM: 136 meq/L (ref 135–145)

## 2014-03-28 LAB — TSH: TSH: 2.22 u[IU]/mL (ref 0.35–4.50)

## 2014-03-28 LAB — LIPID PANEL
CHOLESTEROL: 235 mg/dL — AB (ref 0–200)
HDL: 64.4 mg/dL (ref >39.00)
LDL CALC: 157 mg/dL — AB (ref 0–99)
NonHDL: 170.6
Total CHOL/HDL Ratio: 4
Triglycerides: 66 mg/dL (ref 0.0–149.0)
VLDL: 13.2 mg/dL (ref 0.0–40.0)

## 2014-03-28 LAB — HEPATIC FUNCTION PANEL
ALT: 18 U/L (ref 0–35)
AST: 20 U/L (ref 0–37)
Albumin: 4.5 g/dL (ref 3.5–5.2)
Alkaline Phosphatase: 77 U/L (ref 39–117)
Bilirubin, Direct: 0.1 mg/dL (ref 0.0–0.3)
Total Bilirubin: 0.7 mg/dL (ref 0.2–1.2)
Total Protein: 7.2 g/dL (ref 6.0–8.3)

## 2014-04-04 ENCOUNTER — Encounter: Payer: Self-pay | Admitting: Family Medicine

## 2014-04-04 ENCOUNTER — Ambulatory Visit (INDEPENDENT_AMBULATORY_CARE_PROVIDER_SITE_OTHER): Payer: BC Managed Care – PPO | Admitting: Family Medicine

## 2014-04-04 VITALS — BP 120/80 | HR 84 | Temp 97.9°F | Ht <= 58 in | Wt 107.0 lb

## 2014-04-04 DIAGNOSIS — Z0000 Encounter for general adult medical examination without abnormal findings: Secondary | ICD-10-CM

## 2014-04-04 MED ORDER — VENLAFAXINE HCL ER 150 MG PO CP24: 150.0000 mg | ORAL_CAPSULE | Freq: Every day | ORAL | Status: DC

## 2014-04-04 MED ORDER — ZOLPIDEM TARTRATE 10 MG PO TABS: 10.0000 mg | ORAL_TABLET | Freq: Every evening | ORAL | Status: DC | PRN

## 2014-04-04 MED ORDER — LOSARTAN POTASSIUM 25 MG PO TABS: 25.0000 mg | ORAL_TABLET | Freq: Every day | ORAL | Status: DC

## 2014-04-04 MED ORDER — LEVOTHYROXINE SODIUM 50 MCG PO TABS: 50.0000 ug | ORAL_TABLET | Freq: Every day | ORAL | Status: DC

## 2014-04-04 NOTE — Progress Notes (Signed)
   Subjective:    Patient ID: Sophia Russell, female    DOB: [birth year], 54 y.o.   MRN: [id number]  HPI Patient for complete physical. Her chronic prompt include history of osteoarthritis, hypothyroidism, mild hyperlipidemia, depression. Her depression is currently stable on Effexor. She is compliant with medications. She had Pap smear last year which was normal. Low risk. Never went for colonoscopy last year.  Past Medical History  Diagnosis Date  . DEPRESSION 01/15/2009  . HYPERLIPIDEMIA 01/15/2009  . HYPERTENSION 01/15/2009  . HYPOTHYROIDISM 01/15/2009  . Arthritis    Past Surgical History  Procedure Laterality Date  . Cesarean section  1994, 1996    x2  . Foot surgery Right 2009    x3  . Incisional hernia repair  1996    reports that she has never smoked. She has never used smokeless tobacco. She reports that she drinks about 1.2 oz of alcohol per week. She reports that she does not use illicit drugs. family history includes Hyperlipidemia in an other family member; Hypertension in an other family member. She was adopted. Allergies  Allergen Reactions  . Erythromycin Base Nausea And Vomiting    REACTION: severe stomach cramps  . Ferrous Sulfate     REACTION: severe stomach cramps  . Sulfonamide Derivatives Nausea And Vomiting    Stomach cramps      Review of Systems  Constitutional: Negative for fever, activity change, appetite change, fatigue and unexpected weight change.  HENT: Negative for ear pain, hearing loss, sore throat and trouble swallowing.   Eyes: Negative for visual disturbance.  Respiratory: Negative for cough and shortness of breath.   Cardiovascular: Negative for chest pain and palpitations.  Gastrointestinal: Negative for abdominal pain, diarrhea, constipation and blood in stool.  Endocrine: Negative for polydipsia and polyuria.  Genitourinary: Negative for dysuria and hematuria.  Musculoskeletal: Negative for myalgias, back pain and arthralgias.   Skin: Negative for rash.  Neurological: Negative for dizziness, syncope and headaches.  Hematological: Negative for adenopathy.  Psychiatric/Behavioral: Negative for confusion and dysphoric mood.       Objective:   Physical Exam  Constitutional: She is oriented to person, place, and time. She appears well-developed and well-nourished.  HENT:  Head: Normocephalic and atraumatic.  Eyes: EOM are normal. Pupils are equal, round, and reactive to light.  Neck: Normal range of motion. Neck supple. No thyromegaly present.  Cardiovascular: Normal rate, regular rhythm and normal heart sounds.   No murmur heard. Pulmonary/Chest: Breath sounds normal. No respiratory distress. She has no wheezes. She has no rales.  Abdominal: Soft. Bowel sounds are normal. She exhibits no distension and no mass. There is no tenderness. There is no rebound and no guarding.  Genitourinary:  Breasts are symmetric with no mass  Musculoskeletal: Normal range of motion. She exhibits no edema.  Lymphadenopathy:    She has no cervical adenopathy.  Neurological: She is alert and oriented to person, place, and time. She displays normal reflexes. No cranial nerve deficit.  Skin: No rash noted.  Psychiatric: She has a normal mood and affect. Her behavior is normal. Judgment and thought content normal.          Assessment & Plan:  Complete physical. Colonoscopy scheduled for after May. Refill medications. She has decided to go to every other year Pap smears she is low risk. Continue regular exercise habits. Immunizations up-to-date.

## 2014-04-04 NOTE — Progress Notes (Signed)
Pre visit review using our clinic review tool, if applicable. No additional management support is needed unless otherwise documented below in the visit note. 

## 2014-06-09 ENCOUNTER — Encounter: Payer: Self-pay | Admitting: Internal Medicine

## 2014-07-01 ENCOUNTER — Encounter: Payer: Self-pay | Admitting: Family Medicine

## 2014-07-01 ENCOUNTER — Other Ambulatory Visit: Payer: Self-pay

## 2014-07-01 DIAGNOSIS — Z1231 Encounter for screening mammogram for malignant neoplasm of breast: Secondary | ICD-10-CM

## 2014-07-02 ENCOUNTER — Other Ambulatory Visit: Payer: Self-pay | Admitting: Family Medicine

## 2014-07-02 ENCOUNTER — Encounter: Payer: Self-pay | Admitting: Family Medicine

## 2014-07-02 DIAGNOSIS — Z1211 Encounter for screening for malignant neoplasm of colon: Secondary | ICD-10-CM

## 2014-07-15 ENCOUNTER — Encounter: Payer: Self-pay | Admitting: Family Medicine

## 2014-07-15 ENCOUNTER — Ambulatory Visit (INDEPENDENT_AMBULATORY_CARE_PROVIDER_SITE_OTHER): Payer: BC Managed Care – PPO | Admitting: Family Medicine

## 2014-07-15 VITALS — BP 126/80 | HR 68 | Temp 98.6°F | Wt 110.0 lb

## 2014-07-15 DIAGNOSIS — N907 Vulvar cyst: Secondary | ICD-10-CM

## 2014-07-15 NOTE — Progress Notes (Signed)
   Subjective:    Patient ID: Sophia Russell, female    DOB: [birth year], 54 y.o.   MRN: [id number]  HPI  Patient seen with 3-4 week history of nonpainful left labial lesion. Not growing rapidly. No bleeding. No drainage. No vaginal discharge. Not sexually active in several months. No history of STD. She initially thought this was a pimple and tried squeezing this but did not get anything out  Past Medical History  Diagnosis Date  . DEPRESSION 01/15/2009  . HYPERLIPIDEMIA 01/15/2009  . HYPERTENSION 01/15/2009  . HYPOTHYROIDISM 01/15/2009  . Arthritis    Past Surgical History  Procedure Laterality Date  . Cesarean section  1994, 1996    x2  . Foot surgery Right 2009    x3  . Incisional hernia repair  1996    reports that she has never smoked. She has never used smokeless tobacco. She reports that she drinks about 1.2 oz of alcohol per week. She reports that she does not use illicit drugs. family history includes Hyperlipidemia in an other family member; Hypertension in an other family member. She was adopted. Allergies  Allergen Reactions  . Erythromycin Base Nausea And Vomiting    REACTION: severe stomach cramps  . Ferrous Sulfate     REACTION: severe stomach cramps  . Sulfonamide Derivatives Nausea And Vomiting    Stomach cramps     Review of Systems  Constitutional: Negative for fever and chills.  Genitourinary: Negative for vaginal bleeding, vaginal discharge and vaginal pain.  Hematological: Negative for adenopathy.       Objective:   Physical Exam  Constitutional: She appears well-developed and well-nourished.  Cardiovascular: Normal rate and regular rhythm.   Pulmonary/Chest: Effort normal and breath sounds normal. No respiratory distress. She has no wheezes. She has no rales.  Genitourinary:  Just inside the left labia she has a very small rounded cystic lesion which is nonfluctuant and nontender. Consistent with likely epidermal cyst.            Assessment & Plan:  Benign-appearing epidermal cyst left labia. Reassurance and observation unless this is growing rapidly or becoming painful or showing signs of secondary infection.

## 2014-07-15 NOTE — Progress Notes (Signed)
Pre visit review using our clinic review tool, if applicable. No additional management support is needed unless otherwise documented below in the visit note. 

## 2014-07-15 NOTE — Patient Instructions (Signed)
Epidermal Cyst An epidermal cyst is sometimes called a sebaceous cyst, epidermal inclusion cyst, or infundibular cyst. These cysts usually contain a substance that looks "pasty" or "cheesy" and may have a bad smell. This substance is a protein called keratin. Epidermal cysts are usually found on the face, neck, or trunk. They may also occur in the vaginal area or other parts of the genitalia of both men and women. Epidermal cysts are usually small, painless, slow-growing bumps or lumps that move freely under the skin. It is important not to try to pop them. This may cause an infection and lead to tenderness and swelling. CAUSES  Epidermal cysts may be caused by a deep penetrating injury to the skin or a plugged hair follicle, often associated with acne. SYMPTOMS  Epidermal cysts can become inflamed and cause:  Redness.  Tenderness.  Increased temperature of the skin over the bumps or lumps.  Grayish-white, bad smelling material that drains from the bump or lump. DIAGNOSIS  Epidermal cysts are easily diagnosed by your caregiver during an exam. Rarely, a tissue sample (biopsy) may be taken to rule out other conditions that may resemble epidermal cysts. TREATMENT   Epidermal cysts often get better and disappear on their own. They are rarely ever cancerous.  If a cyst becomes infected, it may become inflamed and tender. This may require opening and draining the cyst. Treatment with antibiotics may be necessary. When the infection is gone, the cyst may be removed with minor surgery.  Small, inflamed cysts can often be treated with antibiotics or by injecting steroid medicines.  Sometimes, epidermal cysts become large and bothersome. If this happens, surgical removal in your caregiver's office may be necessary. HOME CARE INSTRUCTIONS  Only take over-the-counter or prescription medicines as directed by your caregiver.  Take your antibiotics as directed. Finish them even if you start to feel  better. SEEK MEDICAL CARE IF:   Your cyst becomes tender, red, or swollen.  Your condition is not improving or is getting worse.  You have any other questions or concerns. MAKE SURE YOU:  Understand these instructions.  Will watch your condition.  Will get help right away if you are not doing well or get worse. Document Released: 12/26/2003 Document Revised: 04/18/2011 Document Reviewed: 08/02/2010 [facility] Patient Information 2015 [place], [place]. This information is not intended to replace advice given to you by your health care provider. Make sure you discuss any questions you have with your health care provider.  Follow up for any rapid growth or any pain or increased redness or other sign of infection.

## 2014-07-28 ENCOUNTER — Ambulatory Visit
Admission: RE | Admit: 2014-07-28 | Discharge: 2014-07-28 | Disposition: A | Discharge status: Home / Self Care | Payer: BC Managed Care – PPO | Source: Ambulatory Visit

## 2014-07-28 DIAGNOSIS — Z1231 Encounter for screening mammogram for malignant neoplasm of breast: Secondary | ICD-10-CM

## 2014-09-01 ENCOUNTER — Encounter: Payer: Self-pay | Admitting: Family Medicine

## 2014-09-01 ENCOUNTER — Ambulatory Visit (AMBULATORY_SURGERY_CENTER): Payer: BC Managed Care – PPO

## 2014-09-01 VITALS — Ht <= 58 in | Wt 114.8 lb

## 2014-09-01 DIAGNOSIS — Z1211 Encounter for screening for malignant neoplasm of colon: Secondary | ICD-10-CM

## 2014-09-01 MED ORDER — MOVIPREP 100 G PO SOLR: 1.0000 | Freq: Once | ORAL | Status: DC

## 2014-09-01 NOTE — Progress Notes (Signed)
No allergies to eggs or soy No diet/weight loss meds No home oxygen No past problems with anesthesia  Does not want emmi video

## 2014-09-09 ENCOUNTER — Ambulatory Visit (AMBULATORY_SURGERY_CENTER): Payer: BC Managed Care – PPO | Admitting: Internal Medicine

## 2014-09-09 ENCOUNTER — Encounter: Payer: Self-pay | Admitting: Internal Medicine

## 2014-09-09 VITALS — BP 168/97 | HR 55 | Temp 97.3°F | Resp 25 | Ht <= 58 in | Wt 114.0 lb

## 2014-09-09 DIAGNOSIS — D125 Benign neoplasm of sigmoid colon: Secondary | ICD-10-CM

## 2014-09-09 DIAGNOSIS — D122 Benign neoplasm of ascending colon: Secondary | ICD-10-CM

## 2014-09-09 DIAGNOSIS — K6389 Other specified diseases of intestine: Secondary | ICD-10-CM | POA: Diagnosis not present

## 2014-09-09 DIAGNOSIS — Z1211 Encounter for screening for malignant neoplasm of colon: Secondary | ICD-10-CM

## 2014-09-09 MED ORDER — SODIUM CHLORIDE 0.9 % IV SOLN: 500.0000 mL | INTRAVENOUS | Status: DC

## 2014-09-09 NOTE — Op Note (Signed)
[place]  [address] [place], [address]   COLONOSCOPY PROCEDURE REPORT  PATIENT: Sophia, Russell  MR#: [id number] BIRTHDATE: 06-07-1960 , 67  yrs. old GENDER: female ENDOSCOPIST: Eustace Quail, MD REFERRED VQ:[id number] Burchette, M.D. PROCEDURE DATE:  09/09/2014 PROCEDURE:   Colonoscopy, screening and Colonoscopy with snare polypectomy X2 First Screening Colonoscopy - Avg.  risk and is 50 yrs.  old or older Yes.  Prior Negative Screening - Now for repeat screening. N/A  History of Adenoma - Now for follow-up colonoscopy & has been > or = to 3 yrs.  N/A  Polyps removed today? Yes ASA CLASS:   Class II INDICATIONS:Screening for colonic neoplasia and Colorectal Neoplasm Risk Assessment for this procedure is average risk. MEDICATIONS: Monitored anesthesia care and Propofol 200 mg IV  DESCRIPTION OF PROCEDURE:   After the risks benefits and alternatives of the procedure were thoroughly explained, informed consent was obtained.  The digital rectal exam revealed no abnormalities of the rectum.   The [id number]  endoscope was introduced through the anus and advanced to the cecum, which was identified by both the appendix and ileocecal valve. No adverse events experienced.   The quality of the prep was good.  (MoviPrep was used)  The instrument was then slowly withdrawn as the colon was fully examined. Estimated blood loss is zero unless otherwise noted in this procedure report.   COLON FINDINGS: Two polyps were found in the sigmoid colon(3 mm) and ascending colon(6 mm sessile).  A polypectomy was performed with a cold snare.  The resection was complete, the polyp tissue was completely retrieved and sent to histology.   The examination was otherwise normal.  Retroflexed views revealed internal hemorrhoids. The time to cecum = 5.7 Withdrawal time = 13.9   The scope was withdrawn and the procedure completed. COMPLICATIONS: There were no immediate  complications.  ENDOSCOPIC IMPRESSION: 1.   Two polyps were found in the sigmoid colon and ascending colon; polypectomy was performed with a cold snare 2.   The examination was otherwise normal  RECOMMENDATIONS: 1. Follow up colonoscopy in 5 years  eSigned:  Eustace Quail, MD 09/09/2014 9:51 AM   cc: The Patient and Carolann Littler, MD

## 2014-09-09 NOTE — Progress Notes (Signed)
Transferred to recovery room. A/O x3, pleased with MAC.  VSS.  Report to [place], Therapist, sports.

## 2014-09-09 NOTE — Progress Notes (Signed)
Called to room to assist during endoscopic procedure.  Patient ID and intended procedure confirmed with present staff. Received instructions for my participation in the procedure from the performing physician.  

## 2014-09-09 NOTE — Patient Instructions (Signed)
Discharge instructions given. Handout on polyps. Resume previous medications. YOU HAD AN ENDOSCOPIC PROCEDURE TODAY AT THE [facility]:   Refer to the procedure report that was given to you for any specific questions about what was found during the examination.  If the procedure report does not answer your questions, please call your gastroenterologist to clarify.  If you requested that your care partner not be given the details of your procedure findings, then the procedure report has been included in a sealed envelope for you to review at your convenience later.  YOU SHOULD EXPECT: Some feelings of bloating in the abdomen. Passage of more gas than usual.  Walking can help get rid of the air that was put into your GI tract during the procedure and reduce the bloating. If you had a lower endoscopy (such as a colonoscopy or flexible sigmoidoscopy) you may notice spotting of blood in your stool or on the toilet paper. If you underwent a bowel prep for your procedure, you may not have a normal bowel movement for a few days.  Please Note:  You might notice some irritation and congestion in your nose or some drainage.  This is from the oxygen used during your procedure.  There is no need for concern and it should clear up in a day or so.  SYMPTOMS TO REPORT IMMEDIATELY:   Following lower endoscopy (colonoscopy or flexible sigmoidoscopy):  Excessive amounts of blood in the stool  Significant tenderness or worsening of abdominal pains  Swelling of the abdomen that is new, acute  Fever of 100F or higher   For urgent or emergent issues, a gastroenterologist can be reached at any hour by calling [phone].   DIET: Your first meal following the procedure should be a small meal and then it is ok to progress to your normal diet. Heavy or fried foods are harder to digest and may make you feel nauseous or bloated.  Likewise, meals heavy in dairy and vegetables can increase bloating.  Drink  plenty of fluids but you should avoid alcoholic beverages for 24 hours.  ACTIVITY:  You should plan to take it easy for the rest of today and you should NOT DRIVE or use heavy machinery until tomorrow (because of the sedation medicines used during the test).    FOLLOW UP: Our staff will call the number listed on your records the next business day following your procedure to check on you and address any questions or concerns that you may have regarding the information given to you following your procedure. If we do not reach you, we will leave a message.  However, if you are feeling well and you are not experiencing any problems, there is no need to return our call.  We will assume that you have returned to your regular daily activities without incident.  If any biopsies were taken you will be contacted by phone or by letter within the next 1-3 weeks.  Please call us at [phone] if you have not heard about the biopsies in 3 weeks.    SIGNATURES/CONFIDENTIALITY: You and/or your care partner have signed paperwork which will be entered into your electronic medical record.  These signatures attest to the fact that that the information above on your After Visit Summary has been reviewed and is understood.  Full responsibility of the confidentiality of this discharge information lies with you and/or your care-partner. 

## 2014-09-10 ENCOUNTER — Telehealth: Payer: Self-pay | Admitting: *Deleted

## 2014-09-10 NOTE — Telephone Encounter (Signed)
YOU HAD AN ENDOSCOPIC PROCEDURE TODAY AT THE [facility]:   Refer to the procedure report that was given to you for any specific questions about what was found during the examination.  If the procedure report does not answer your questions, please call your gastroenterologist to clarify.  If you requested that your care partner not be given the details of your procedure findings, then the procedure report has been included in a sealed envelope for you to review at your convenience later.  YOU SHOULD EXPECT: Some feelings of bloating in the abdomen. Passage of more gas than usual.  Walking can help get rid of the air that was put into your GI tract during the procedure and reduce the bloating. If you had a lower endoscopy (such as a colonoscopy or flexible sigmoidoscopy) you may notice spotting of blood in your stool or on the toilet paper. If you underwent a bowel prep for your procedure, you may not have a normal bowel movement for a few days.  Please Note:  You might notice some irritation and congestion in your nose or some drainage.  This is from the oxygen used during your procedure.  There is no need for concern and it should clear up in a day or so.  SYMPTOMS TO REPORT IMMEDIATELY:   Following lower endoscopy (colonoscopy or flexible sigmoidoscopy):  Excessive amounts of blood in the stool  Significant tenderness or worsening of abdominal pains  Swelling of the abdomen that is new, acute  Fever of 100F or higher   Following upper endoscopy (EGD)  Vomiting of blood or coffee ground material  New chest pain or pain under the shoulder blades  Painful or persistently difficult swallowing  New shortness of breath  Fever of 100F or higher  Black, tarry-looking stools  For urgent or emergent issues, a gastroenterologist can be reached at any hour by calling [phone].   DIET: Your first meal following the procedure should be a small meal and then it is ok to progress to  your normal diet. Heavy or fried foods are harder to digest and may make you feel nauseous or bloated.  Likewise, meals heavy in dairy and vegetables can increase bloating.  Drink plenty of fluids but you should avoid alcoholic beverages for 24 hours.  ACTIVITY:  You should plan to take it easy for the rest of today and you should NOT DRIVE or use heavy machinery until tomorrow (because of the sedation medicines used during the test).    FOLLOW UP: Our staff will call the number listed on your records the next business day following your procedure to check on you and address any questions or concerns that you may have regarding the information given to you following your procedure. If we do not reach you, we will leave a message.  However, if you are feeling well and you are not experiencing any problems, there is no need to return our call.  We will assume that you have returned to your regular daily activities without incident.  If any biopsies were taken you will be contacted by phone or by letter within the next 1-3 weeks.  Please call us at [phone] if you have not heard about the biopsies in 3 weeks.    SIGNATURES/CONFIDENTIALITY: You and/or your care partner have signed paperwork which will be entered into your electronic medical record.  These signatures attest to the fact that that the information above on your After Visit Summary has been reviewed   and is understood.  Full responsibility of the confidentiality of this discharge information lies with you and/or your care-partner. 

## 2014-09-10 NOTE — Telephone Encounter (Signed)
  Follow up Call-  Call back number 09/09/2014  Post procedure Call Back phone  # [phone]  Permission to leave phone message Yes      No answer left message to call if questins or concerns.

## 2014-09-23 ENCOUNTER — Encounter: Payer: Self-pay | Admitting: Internal Medicine

## 2014-10-17 ENCOUNTER — Other Ambulatory Visit: Payer: Self-pay | Admitting: Neurological Surgery

## 2014-10-24 ENCOUNTER — Telehealth: Payer: Self-pay | Admitting: Family Medicine

## 2014-10-24 NOTE — Telephone Encounter (Signed)
Need to verify how high BP is.  Would be happy to see in follow up.  If concerns this is consistently > 140/90 would go back to the 50 dose of Losartan.

## 2014-10-24 NOTE — Telephone Encounter (Signed)
Pt states her bp has been running a little higher, probably for 6 months. Pt instructed to take 1/2 her dose on 04/04/14. Pt just started monitoring a moth ago, doesn't have any exact reading , but knows they were higher than should be. Pt thinks she may need to go back on her original amount of losartan (COZAAR) 50 MG tablet  Pt has alot of the 25 mg if she needs to start taking 50mg   Does pt need appt?  [facility] [address]

## 2014-10-27 NOTE — Telephone Encounter (Signed)
Sophia Russell, please schedule appointment for patient to be seen to check for BP and medication dosage.

## 2014-10-27 NOTE — Telephone Encounter (Signed)
Lm to cb and schedule appt

## 2014-11-05 ENCOUNTER — Encounter: Payer: Self-pay | Admitting: Family Medicine

## 2014-11-05 ENCOUNTER — Ambulatory Visit (INDEPENDENT_AMBULATORY_CARE_PROVIDER_SITE_OTHER): Payer: BC Managed Care – PPO | Admitting: Family Medicine

## 2014-11-05 VITALS — BP 130/90 | HR 76 | Temp 98.1°F | Ht <= 58 in | Wt 111.9 lb

## 2014-11-05 DIAGNOSIS — Z23 Encounter for immunization: Secondary | ICD-10-CM

## 2014-11-05 DIAGNOSIS — I10 Essential (primary) hypertension: Secondary | ICD-10-CM

## 2014-11-05 MED ORDER — LOSARTAN POTASSIUM 50 MG PO TABS: 50.0000 mg | ORAL_TABLET | Freq: Every day | ORAL | Status: DC

## 2014-11-05 MED ORDER — ZOLPIDEM TARTRATE 10 MG PO TABS: 10.0000 mg | ORAL_TABLET | Freq: Every evening | ORAL | Status: DC | PRN

## 2014-11-05 NOTE — Progress Notes (Signed)
Pre visit review using our clinic review tool, if applicable. No additional management support is needed unless otherwise documented below in the visit note. 

## 2014-11-05 NOTE — Progress Notes (Signed)
   Subjective:    Patient ID: Sophia Russell, female    DOB: 1960-08-29, 54 y.o.   MRN: [id number]  HPI Patient seen for follow-up hypertension. She takes low-dose losartan 25 mg once daily. She brings in a log of recent blood pressure readings has had several readings over 539 systolic and over 90 diastolic. She is fairly active. She's not had any recent dizziness or chest pains. She's had some mild dull headaches. Some malaise. She is adopted but is aware that there is a family history of hypertension in her biologic parents. No excessive alcohol use.  Hypothyroidism treated with levothyroxin.  Past Medical History  Diagnosis Date  . DEPRESSION 01/15/2009  . HYPERLIPIDEMIA 01/15/2009  . HYPERTENSION 01/15/2009  . HYPOTHYROIDISM 01/15/2009  . Arthritis    Past Surgical History  Procedure Laterality Date  . Cesarean section  1994, 1996    x2  . Foot surgery Right 2009    x3  . Incisional hernia repair  1996    reports that she has never smoked. She has never used smokeless tobacco. She reports that she drinks about 1.2 oz of alcohol per week. She reports that she does not use illicit drugs. family history includes Hyperlipidemia in an other family member; Hypertension in an other family member. There is no history of Colon cancer. She was adopted. Allergies  Allergen Reactions  . Erythromycin Base Nausea And Vomiting    REACTION: severe stomach cramps  . Ferrous Sulfate     REACTION: severe stomach cramps  . Sulfonamide Derivatives Nausea And Vomiting    Stomach cramps      Review of Systems  Constitutional: Positive for fatigue.  Eyes: Negative for visual disturbance.  Respiratory: Negative for cough, chest tightness, shortness of breath and wheezing.   Cardiovascular: Negative for chest pain, palpitations and leg swelling.  Neurological: Positive for headaches. Negative for dizziness, seizures, syncope, weakness and light-headedness.       Objective:   Physical  Exam  Constitutional: She appears well-developed and well-nourished.  Neck: Neck supple. No thyromegaly present.  Cardiovascular: Normal rate and regular rhythm.   No murmur heard. Pulmonary/Chest: Effort normal and breath sounds normal. No respiratory distress. She has no wheezes. She has no rales.  Musculoskeletal: She exhibits no edema.          Assessment & Plan:  Hypertension. Suboptimally controlled. Titrate losartan to 50 mg once daily. Monitor closely and be in touch if not consistently less than 140/90 over the next month

## 2014-11-05 NOTE — Patient Instructions (Signed)
Monitor blood pressure and be in touch if consistently > 140/90.   

## 2014-11-12 ENCOUNTER — Telehealth: Payer: Self-pay | Admitting: Family Medicine

## 2014-11-12 NOTE — Telephone Encounter (Signed)
Please advise if you want to change dosage of blood pressure medication or schedule an appointment?

## 2014-11-12 NOTE — Telephone Encounter (Signed)
Patient Name: Sophia Russell DOB: [birth year] Initial Comment Caller states bp is 146/85. states new dose is not is not bringing down bp. before taking medicine this morning it was 134/89.Started taking new dose on the 29th. wants to know if needs a diffrent dose or diffrent medication Nurse Assessment Nurse: Ronnald Ramp, RN, Miranda Date/Time (Eastern Time): 11/12/2014 1:31:16 PM Confirm and document reason for call. If symptomatic, describe symptoms. ---Caller states her BP medication was adjusted 6 days ago and she is concerned because her BP is still high. Today it was 146/85. Has the patient traveled out of the country within the last 30 days? ---No Does the patient have any new or worsening symptoms? ---Yes Will a triage be completed? ---Yes Related visit to physician within the last 2 weeks? ---Yes Does the PT have any chronic conditions? (i.e. diabetes, asthma, etc.) ---Yes List chronic conditions. ---HTN Did the patient indicate they were pregnant? ---No Guidelines Guideline Title Affirmed Question Affirmed Notes High Blood Pressure BP 120-139 / 80-89 (all triage questions negative) Final Disposition User Wilkinson, RN, Miranda Disagree/Comply: Leta Baptist

## 2014-11-12 NOTE — Telephone Encounter (Signed)
Go ahead and increase Losartan to 100 mg po qd. We need to reassess in 4-6 weeks.

## 2014-11-13 NOTE — Telephone Encounter (Signed)
Left detailed message on personal voicemail to go ahead and increase Losartan to 100 mg daily per Dr. Elease Hashimoto and he would like to reassess you in 4-6 weeks please call office at [phone] to schedule appt. Any questions please call.

## 2014-12-15 ENCOUNTER — Encounter: Payer: Self-pay | Admitting: Family Medicine

## 2014-12-31 ENCOUNTER — Ambulatory Visit (INDEPENDENT_AMBULATORY_CARE_PROVIDER_SITE_OTHER): Payer: BC Managed Care – PPO | Admitting: Family Medicine

## 2014-12-31 ENCOUNTER — Encounter: Payer: Self-pay | Admitting: Family Medicine

## 2014-12-31 VITALS — BP 159/87 | HR 59 | Temp 97.7°F | Resp 20 | Wt 111.2 lb

## 2014-12-31 DIAGNOSIS — J0100 Acute maxillary sinusitis, unspecified: Secondary | ICD-10-CM | POA: Diagnosis not present

## 2014-12-31 DIAGNOSIS — J320 Chronic maxillary sinusitis: Secondary | ICD-10-CM | POA: Insufficient documentation

## 2014-12-31 MED ORDER — FLUTICASONE PROPIONATE 50 MCG/ACT NA SUSP: 2.0000 | Freq: Every day | NASAL | Status: DC

## 2014-12-31 MED ORDER — AMOXICILLIN-POT CLAVULANATE 875-125 MG PO TABS: 1.0000 | ORAL_TABLET | Freq: Two times a day (BID) | ORAL | Status: DC

## 2014-12-31 NOTE — Progress Notes (Signed)
   Subjective:    Patient ID: Sophia Russell, female    DOB: 1960-08-12, 54 y.o.   MRN: [id number]  HPI  Sinus pressure: Patient presents for a 10 day history of headache, sinus pressure, rhinorrhea and congestion. She states she was seen in urgent care clinic and given steroids and Z-Pak. She states she has taken ibuprofen and Benadryl as well. She's even tried Sudafed but feels like she's not improving. She has used nasal lavage. She has completed her Z-Pak 2 days ago. She is eating and drinking well but feels she's not improving.   Past Medical History  Diagnosis Date  . DEPRESSION 01/15/2009  . HYPERLIPIDEMIA 01/15/2009  . HYPERTENSION 01/15/2009  . HYPOTHYROIDISM 01/15/2009  . Arthritis    Allergies  Allergen Reactions  . Erythromycin Base Nausea And Vomiting    REACTION: severe stomach cramps  . Ferrous Sulfate     REACTION: severe stomach cramps  . Sulfonamide Derivatives Nausea And Vomiting    Stomach cramps    Review of Systems Negative, with the exception of above mentioned in HPI     Objective:   Physical Exam BP 159/87 mmHg  Pulse 59  Temp(Src) 97.7 F (36.5 C) (Oral)  Resp 20  Wt 111 lb 4 oz (50.463 kg)  SpO2 100% Gen: Afebrile. No acute distress. Nontoxic in appearance, well-developed, well-nourished, Caucasian female. Appears fatigued. HENT: AT. [place]. Bilateral TM visualized with no erythema or bulging, full membranes. MMM. Bilateral nares with erythema and swelling. Throat without erythema or exudates. Cough present on exam. Hoarseness present on exam. Tender to palpation left maxillary sinus. Eyes:Pupils Equal Round Reactive to light, Extraocular movements intact,  Conjunctiva without redness, discharge or icterus. Neck/lymp/endocrine: Supple, mild anterior cervical lymphadenopathy CV: RRR  Chest: CTAB, no wheeze or crackles Abd: Soft. NTND. BS present.  Skin: No rashes, purpura or petechiae.     Assessment & Plan:  1. Acute maxillary sinusitis,  recurrence not specified - Discussed with patient azithromycin is likely still persistent despite finishing his back yesterday. She does have signs of sinusitis, without any improving symptoms. Patient is to finish her steroid Dosepak. Start Augmentin. Start Flonase. These have been prescribed to her today. Rest, hydration, over-the-counter cough syrup if needed. Tylenol/Advil for headaches or pains. - amoxicillin-clavulanate (AUGMENTIN) 875-125 MG tablet; Take 1 tablet by mouth 2 (two) times daily.  Dispense: 20 tablet; Refill: 0 - fluticasone (FLONASE) 50 MCG/ACT nasal spray; Place 2 sprays into both nostrils daily.  Dispense: 16 g; Refill: 6

## 2014-12-31 NOTE — Patient Instructions (Signed)
Sinusitis, Adult Sinusitis is redness, soreness, and inflammation of the paranasal sinuses. Paranasal sinuses are air pockets within the bones of your face. They are located beneath your eyes, in the middle of your forehead, and above your eyes. In healthy paranasal sinuses, mucus is able to drain out, and air is able to circulate through them by way of your nose. However, when your paranasal sinuses are inflamed, mucus and air can become trapped. This can allow bacteria and other germs to grow and cause infection. Sinusitis can develop quickly and last only a short time (acute) or continue over a long period (chronic). Sinusitis that lasts for more than 12 weeks is considered chronic. CAUSES Causes of sinusitis include:  Allergies.  Structural abnormalities, such as displacement of the cartilage that separates your nostrils (deviated septum), which can decrease the air flow through your nose and sinuses and affect sinus drainage.  Functional abnormalities, such as when the small hairs (cilia) that line your sinuses and help remove mucus do not work properly or are not present. SIGNS AND SYMPTOMS Symptoms of acute and chronic sinusitis are the same. The primary symptoms are pain and pressure around the affected sinuses. Other symptoms include:  Upper toothache.  Earache.  Headache.  Bad breath.  Decreased sense of smell and taste.  A cough, which worsens when you are lying flat.  Fatigue.  Fever.  Thick drainage from your nose, which often is green and may contain pus (purulent).  Swelling and warmth over the affected sinuses. DIAGNOSIS Your health care provider will perform a physical exam. During your exam, your health care provider may perform any of the following to help determine if you have acute sinusitis or chronic sinusitis:  Look in your nose for signs of abnormal growths in your nostrils (nasal polyps).  Tap over the affected sinus to check for signs of  infection.  View the inside of your sinuses using an imaging device that has a light attached (endoscope). If your health care provider suspects that you have chronic sinusitis, one or more of the following tests may be recommended:  Allergy tests.  Nasal culture. A sample of mucus is taken from your nose, sent to a lab, and screened for bacteria.  Nasal cytology. A sample of mucus is taken from your nose and examined by your health care provider to determine if your sinusitis is related to an allergy. TREATMENT Most cases of acute sinusitis are related to a viral infection and will resolve on their own within 10 days. Sometimes, medicines are prescribed to help relieve symptoms of both acute and chronic sinusitis. These may include pain medicines, decongestants, nasal steroid sprays, or saline sprays. However, for sinusitis related to a bacterial infection, your health care provider will prescribe antibiotic medicines. These are medicines that will help kill the bacteria causing the infection. Rarely, sinusitis is caused by a fungal infection. In these cases, your health care provider will prescribe antifungal medicine. For some cases of chronic sinusitis, surgery is needed. Generally, these are cases in which sinusitis recurs more than 3 times per year, despite other treatments. HOME CARE INSTRUCTIONS  Drink plenty of water. Water helps thin the mucus so your sinuses can drain more easily.  Use a humidifier.  Inhale steam 3-4 times a day (for example, sit in the bathroom with the shower running).  Apply a warm, moist washcloth to your face 3-4 times a day, or as directed by your health care provider.  Use saline nasal sprays to help   moisten and clean your sinuses.  Take medicines only as directed by your health care provider.  If you were prescribed either an antibiotic or antifungal medicine, finish it all even if you start to feel better. SEEK IMMEDIATE MEDICAL CARE IF:  You have  increasing pain or severe headaches.  You have nausea, vomiting, or drowsiness.  You have swelling around your face.  You have vision problems.  You have a stiff neck.  You have difficulty breathing.   This information is not intended to replace advice given to you by your health care provider. Make sure you discuss any questions you have with your health care provider.   Document Released: 01/24/2005 Document Revised: 02/14/2014 Document Reviewed: 02/08/2011 Elsevier Interactive Patient Education 2016 [employer].  Mucinex for secretions.  Augmentin antibiotics for 10 days.  Rest and hydrate Flonase for nasal comfort/healing.

## 2015-01-21 ENCOUNTER — Inpatient Hospital Stay (HOSPITAL_COMMUNITY)
Admission: RE | Admit: 2015-01-21 | Payer: BC Managed Care – PPO | Source: Ambulatory Visit | Admitting: Neurological Surgery

## 2015-01-21 ENCOUNTER — Encounter (HOSPITAL_COMMUNITY): Admission: RE | Payer: Self-pay | Source: Ambulatory Visit

## 2015-01-21 SURGERY — FOR MAXIMUM ACCESS (MAS) POSTERIOR LUMBAR INTERBODY FUSION (PLIF) 1 LEVEL
Anesthesia: General | Site: Back

## 2015-01-27 ENCOUNTER — Telehealth: Payer: Self-pay | Admitting: Family Medicine

## 2015-01-27 NOTE — Telephone Encounter (Signed)
FYI.  Travel care given. Advised to see local urgent care in city she is in.

## 2015-01-27 NOTE — Telephone Encounter (Signed)
Patient Name: SERETHA IMGRUND DOB: Jun 25, 1960 Initial Comment Caller States she has sinus pressure and pain. she is traveling out of state Nurse Assessment Nurse: Ronnald Ramp, RN, Miranda Date/Time (Eastern Time): 01/27/2015 10:03:21 AM Confirm and document reason for call. If symptomatic, describe symptoms. ---Caller states yesterday she started having headache, sinus pain, and left ear pain. She was treated a few weeks ago for a sinus infection, completed the antibiotics, and symptoms resolved. Has the patient traveled out of the country within the last 30 days? ---No Does the patient have any new or worsening symptoms? ---Yes Will a triage be completed? ---Yes Related visit to physician within the last 2 weeks? ---No Does the PT have any chronic conditions? (i.e. diabetes, asthma, etc.) ---Yes List chronic conditions. ---HTN, Thyroid, Depression Did the patient indicate they were pregnant? ---No Is this a behavioral health or substance abuse call? ---No Guidelines Guideline Title Affirmed Question Affirmed Notes Sinus Pain or Congestion Earache Final Disposition User See Physician within 24 Hours Jones, Therapist, sports, Miranda Comments Pt states she is out of town and told her she will have to go to UC in the area where she is. Referrals GO TO FACILITY OTHER - SPECIFY

## 2015-04-24 ENCOUNTER — Other Ambulatory Visit (INDEPENDENT_AMBULATORY_CARE_PROVIDER_SITE_OTHER): Payer: BC Managed Care – PPO

## 2015-04-24 DIAGNOSIS — Z0000 Encounter for general adult medical examination without abnormal findings: Secondary | ICD-10-CM | POA: Diagnosis not present

## 2015-04-24 LAB — HEPATIC FUNCTION PANEL
ALBUMIN: 4.5 g/dL (ref 3.5–5.2)
ALT: 20 U/L (ref 0–35)
AST: 17 U/L (ref 0–37)
Alkaline Phosphatase: 65 U/L (ref 39–117)
BILIRUBIN TOTAL: 0.5 mg/dL (ref 0.2–1.2)
Bilirubin, Direct: 0.1 mg/dL (ref 0.0–0.3)
Total Protein: 7.1 g/dL (ref 6.0–8.3)

## 2015-04-24 LAB — LIPID PANEL
CHOLESTEROL: 273 mg/dL — AB (ref 0–200)
HDL: 60.1 mg/dL (ref >39.00)
LDL CALC: 198 mg/dL — AB (ref 0–99)
NonHDL: 213.29
TRIGLYCERIDES: 78 mg/dL (ref 0.0–149.0)
Total CHOL/HDL Ratio: 5
VLDL: 15.6 mg/dL (ref 0.0–40.0)

## 2015-04-24 LAB — CBC WITH DIFFERENTIAL/PLATELET
BASOS PCT: 0.8 % (ref 0.0–3.0)
Basophils Absolute: 0 10*3/uL (ref 0.0–0.1)
EOS PCT: 4 % (ref 0.0–5.0)
Eosinophils Absolute: 0.2 10*3/uL (ref 0.0–0.7)
HCT: 41.8 % (ref 36.0–46.0)
Hemoglobin: 14.3 g/dL (ref 12.0–15.0)
LYMPHS ABS: 2.1 10*3/uL (ref 0.7–4.0)
Lymphocytes Relative: 45.2 % (ref 12.0–46.0)
MCHC: 34.3 g/dL (ref 30.0–36.0)
MCV: 85.6 fl (ref 78.0–100.0)
MONO ABS: 0.4 10*3/uL (ref 0.1–1.0)
MONOS PCT: 9.2 % (ref 3.0–12.0)
NEUTROS ABS: 1.9 10*3/uL (ref 1.4–7.7)
NEUTROS PCT: 40.8 % — AB (ref 43.0–77.0)
PLATELETS: 285 10*3/uL (ref 150.0–400.0)
RBC: 4.88 Mil/uL (ref 3.87–5.11)
RDW: 14.1 % (ref 11.5–15.5)
WBC: 4.7 10*3/uL (ref 4.0–10.5)

## 2015-04-24 LAB — BASIC METABOLIC PANEL
BUN: 25 mg/dL — AB (ref 6–23)
CO2: 31 meq/L (ref 19–32)
Calcium: 9.9 mg/dL (ref 8.4–10.5)
Chloride: 104 mEq/L (ref 96–112)
Creatinine, Ser: 0.65 mg/dL (ref 0.40–1.20)
GFR: 101.52 mL/min (ref >60.00)
GLUCOSE: 79 mg/dL (ref 70–99)
POTASSIUM: 4.1 meq/L (ref 3.5–5.1)
Sodium: 140 mEq/L (ref 135–145)

## 2015-04-24 LAB — TSH: TSH: 1.28 u[IU]/mL (ref 0.35–4.50)

## 2015-04-28 ENCOUNTER — Ambulatory Visit (INDEPENDENT_AMBULATORY_CARE_PROVIDER_SITE_OTHER): Payer: BC Managed Care – PPO | Admitting: Family Medicine

## 2015-04-28 ENCOUNTER — Other Ambulatory Visit (HOSPITAL_COMMUNITY)
Admission: RE | Admit: 2015-04-28 | Discharge: 2015-04-28 | Disposition: A | Discharge status: Home / Self Care | Payer: BC Managed Care – PPO | Source: Ambulatory Visit | Attending: Family Medicine | Admitting: Family Medicine

## 2015-04-28 VITALS — BP 138/92 | HR 70 | Temp 98.2°F | Ht <= 58 in | Wt 113.4 lb

## 2015-04-28 DIAGNOSIS — Z0000 Encounter for general adult medical examination without abnormal findings: Secondary | ICD-10-CM

## 2015-04-28 DIAGNOSIS — Z01419 Encounter for gynecological examination (general) (routine) without abnormal findings: Secondary | ICD-10-CM | POA: Insufficient documentation

## 2015-04-28 MED ORDER — LEVOTHYROXINE SODIUM 50 MCG PO TABS: 50.0000 ug | ORAL_TABLET | Freq: Every day | ORAL | Status: DC

## 2015-04-28 MED ORDER — VENLAFAXINE HCL ER 150 MG PO CP24: 150.0000 mg | ORAL_CAPSULE | Freq: Every day | ORAL | Status: DC

## 2015-04-28 MED ORDER — LOSARTAN POTASSIUM 100 MG PO TABS: 100.0000 mg | ORAL_TABLET | Freq: Every day | ORAL | Status: DC

## 2015-04-28 MED ORDER — FLUOCINONIDE 0.05 % EX SOLN: CUTANEOUS | Status: DC

## 2015-04-28 NOTE — Progress Notes (Signed)
Subjective:    Patient ID: Sophia Russell, female    DOB: December 06, 1960, 55 y.o.   MRN: [id number]  HPI Patient seen for physical exam. She has history of hypothyroidism and hypertension. Blood pressures have been running consistently over 140/90. She currently takes losartan 50 mg once daily. Last Pap smear 2 years ago normal. Low risk.  She's been getting yearly mammograms. Immunizations are up-to-date. Family history is unknown as she is adopted  Past Medical History  Diagnosis Date  . DEPRESSION 01/15/2009  . HYPERLIPIDEMIA 01/15/2009  . HYPERTENSION 01/15/2009  . HYPOTHYROIDISM 01/15/2009  . Arthritis    Past Surgical History  Procedure Laterality Date  . Cesarean section  1994, 1996    x2  . Foot surgery Right 2009    x3  . Incisional hernia repair  1996    reports that she has never smoked. She has never used smokeless tobacco. She reports that she drinks about 1.2 oz of alcohol per week. She reports that she does not use illicit drugs. family history is negative for Colon cancer. She was adopted. Allergies  Allergen Reactions  . Erythromycin Base Nausea And Vomiting    REACTION: severe stomach cramps  . Ferrous Sulfate     REACTION: severe stomach cramps  . Sulfonamide Derivatives Nausea And Vomiting    Stomach cramps      Review of Systems  Constitutional: Negative for fever, activity change, appetite change, fatigue and unexpected weight change.  HENT: Negative for ear pain, hearing loss, sore throat and trouble swallowing.   Eyes: Negative for visual disturbance.  Respiratory: Negative for cough and shortness of breath.   Cardiovascular: Negative for chest pain and palpitations.  Gastrointestinal: Negative for abdominal pain, diarrhea, constipation and blood in stool.  Genitourinary: Negative for dysuria and hematuria.  Musculoskeletal: Negative for myalgias, back pain and arthralgias.  Skin: Negative for rash.  Neurological: Negative for dizziness, syncope  and headaches.  Hematological: Negative for adenopathy.  Psychiatric/Behavioral: Negative for confusion and dysphoric mood.       Objective:   Physical Exam  Constitutional: She is oriented to person, place, and time. She appears well-developed and well-nourished.  HENT:  Head: Normocephalic and atraumatic.  Eyes: EOM are normal. Pupils are equal, round, and reactive to light.  Neck: Normal range of motion. Neck supple. No thyromegaly present.  Cardiovascular: Normal rate, regular rhythm and normal heart sounds.   No murmur heard. Pulmonary/Chest: Breath sounds normal. No respiratory distress. She has no wheezes. She has no rales.  Breasts are symmetric with no masses  Abdominal: Soft. Bowel sounds are normal. She exhibits no distension and no mass. There is no tenderness. There is no rebound and no guarding.  Genitourinary: Vagina normal and uterus normal. No vaginal discharge found.  Cervix normal in appearance.  Pap obtained.  Bimanual no mass or tenderness.  Musculoskeletal: Normal range of motion. She exhibits no edema.  Lymphadenopathy:    She has no cervical adenopathy.  Neurological: She is alert and oriented to person, place, and time. She displays normal reflexes. No cranial nerve deficit.  Skin: No rash noted.  Psychiatric: She has a normal mood and affect. Her behavior is normal. Judgment and thought content normal.          Assessment & Plan:  Physical exam. Labs reviewed. She has elevated lipids but overall 10 year risk for CAD event is only 3.1%. She is not interested in statin therapy at this time. Dietary factors discussed. Pap smear obtained.  Patient will continue either with yearly or every other year mammograms. She is undecided. Her colonoscopy is up-to-date. Increase losartan to 100 mg daily. Refill thyroid medication for one year.

## 2015-04-28 NOTE — Patient Instructions (Signed)
Go ahead and increase losartan to 100 mg daily Continue to monitor blood pressure and be in touch if not consistently less than 140/90. Consider over-the-counter red yeast rice extract- to help lower cholesterol

## 2015-04-29 ENCOUNTER — Encounter: Payer: Self-pay | Admitting: Family Medicine

## 2015-04-29 LAB — CYTOLOGY - PAP

## 2015-06-08 ENCOUNTER — Other Ambulatory Visit: Payer: Self-pay

## 2015-06-14 ENCOUNTER — Encounter: Payer: Self-pay | Admitting: Family Medicine

## 2015-08-24 ENCOUNTER — Encounter: Payer: Self-pay | Admitting: Family Medicine

## 2015-08-24 ENCOUNTER — Other Ambulatory Visit: Payer: Self-pay | Admitting: Family Medicine

## 2015-08-25 ENCOUNTER — Ambulatory Visit: Payer: BC Managed Care – PPO | Admitting: Sports Medicine

## 2015-08-25 MED ORDER — ZOLPIDEM TARTRATE 10 MG PO TABS: 10.0000 mg | ORAL_TABLET | Freq: Every evening | ORAL | Status: DC | PRN

## 2015-08-25 NOTE — Telephone Encounter (Signed)
Last OV 04/28/2015 Last refill 04/28/2015 #30, 0rf Please advise if okay to send in

## 2015-08-25 NOTE — Telephone Encounter (Signed)
Refill once okay

## 2015-08-27 ENCOUNTER — Other Ambulatory Visit (INDEPENDENT_AMBULATORY_CARE_PROVIDER_SITE_OTHER): Payer: BC Managed Care – PPO

## 2015-08-27 ENCOUNTER — Encounter: Payer: Self-pay | Admitting: Podiatry

## 2015-08-27 ENCOUNTER — Ambulatory Visit (INDEPENDENT_AMBULATORY_CARE_PROVIDER_SITE_OTHER): Payer: BC Managed Care – PPO

## 2015-08-27 ENCOUNTER — Ambulatory Visit (INDEPENDENT_AMBULATORY_CARE_PROVIDER_SITE_OTHER): Payer: BC Managed Care – PPO | Admitting: Podiatry

## 2015-08-27 VITALS — BP 142/89 | HR 75 | Resp 14 | Ht <= 58 in | Wt 115.0 lb

## 2015-08-27 DIAGNOSIS — M779 Enthesopathy, unspecified: Secondary | ICD-10-CM | POA: Diagnosis not present

## 2015-08-27 DIAGNOSIS — M21619 Bunion of unspecified foot: Secondary | ICD-10-CM | POA: Diagnosis not present

## 2015-08-27 DIAGNOSIS — E785 Hyperlipidemia, unspecified: Secondary | ICD-10-CM | POA: Diagnosis not present

## 2015-08-27 DIAGNOSIS — M79671 Pain in right foot: Secondary | ICD-10-CM

## 2015-08-27 LAB — LIPID PANEL
CHOLESTEROL: 247 mg/dL — AB (ref 0–200)
HDL: 66.3 mg/dL (ref >39.00)
LDL Cholesterol: 169 mg/dL — ABNORMAL HIGH (ref 0–99)
NonHDL: 180.68
Total CHOL/HDL Ratio: 4
Triglycerides: 56 mg/dL (ref 0.0–149.0)
VLDL: 11.2 mg/dL (ref 0.0–40.0)

## 2015-08-27 NOTE — Patient Instructions (Signed)
Pre-Operative Instructions  Congratulations, you have decided to take an important step to improving your quality of life.  You can be assured that the doctors of [facility] will be with you every step of the way.  1. Plan to be at the surgery center/hospital at least 1 (one) hour prior to your scheduled time unless otherwise directed by the surgical center/hospital staff.  You must have a responsible adult accompany you, remain during the surgery and drive you home.  Make sure you have directions to the surgical center/hospital and know how to get there on time. 2. For hospital based surgery you will need to obtain a history and physical form from your family physician within 1 month prior to the date of surgery- we will give you a form for you primary physician.  3. We make every effort to accommodate the date you request for surgery.  There are however, times where surgery dates or times have to be moved.  We will contact you as soon as possible if a change in schedule is required.   4. No Aspirin/Ibuprofen for one week before surgery.  If you are on aspirin, any non-steroidal anti-inflammatory medications (Mobic, Aleve, Ibuprofen) you should stop taking it 7 days prior to your surgery.  You make take Tylenol  For pain prior to surgery.  5. Medications- If you are taking daily heart and blood pressure medications, seizure, reflux, allergy, asthma, anxiety, pain or diabetes medications, make sure the surgery center/hospital is aware before the day of surgery so they may notify you which medications to take or avoid the day of surgery. 6. No food or drink after midnight the night before surgery unless directed otherwise by surgical center/hospital staff. 7. No alcoholic beverages 24 hours prior to surgery.  No smoking 24 hours prior to or 24 hours after surgery. 8. Wear loose pants or shorts- loose enough to fit over bandages, boots, and casts. 9. No slip on shoes, sneakers are best. 10. Bring  your boot with you to the surgery center/hospital.  Also bring crutches or a walker if your physician has prescribed it for you.  If you do not have this equipment, it will be provided for you after surgery. 11. If you have not been contracted by the surgery center/hospital by the day before your surgery, call to confirm the date and time of your surgery. 12. Leave-time from work may vary depending on the type of surgery you have.  Appropriate arrangements should be made prior to surgery with your employer. 13. Prescriptions will be provided immediately following surgery by your doctor.  Have these filled as soon as possible after surgery and take the medication as directed. 14. Remove nail polish on the operative foot. 15. Wash the night before surgery.  The night before surgery wash the foot and leg well with the antibacterial soap provided and water paying special attention to beneath the toenails and in between the toes.  Rinse thoroughly with water and dry well with a towel.  Perform this wash unless told not to do so by your physician.  Enclosed: 1 Ice pack (please put in freezer the night before surgery)   1 Hibiclens skin cleaner   Pre-op Instructions  If you have any questions regarding the instructions, do not hesitate to call our office.  [place]: [address] [place], [place] [address] [phone]  [place]: [address] [place], [place] [address] [phone]  [place]: [address]  [place], [place] [address] [phone]   Dr.   Avenly Roberge DPM, Dr. Matthew Wagoner DPM, Dr. M. Todd Hyatt DPM, Dr. Titorya Stover DPM 

## 2015-08-27 NOTE — Progress Notes (Signed)
   Subjective:    Patient ID: Sophia Russell, female    DOB: 01-Sep-1960, 55 y.o.   MRN: [id number]  HPI  I want to have that aiken procedure for the bunion on my right foot.  I am also very flat footed and want orthotics.     Review of Systems  Hematological: Bruises/bleeds easily.       Objective:   Physical Exam        Assessment & Plan:

## 2015-08-27 NOTE — Addendum Note (Signed)
Addended by: Elmer Picker on: 08/27/2015 08:44 AM   Modules accepted: Orders

## 2015-08-27 NOTE — Progress Notes (Addendum)
Subjective:     Patient ID: Sophia Russell, female   DOB: 18-Nov-1960, 55 y.o.   MRN: [id number]  HPI patient states she's had a history of bunion deformity right foot and has had 2 surgeries on this and it is giving her problems with the big toe and she has tried to wear wider shoes and trim and pad without relief. She had the first surgery in 1989 and the second one in 2006 also complains about her tendon on the inside of the ankles hurting on both feet with flatfoot deformity noted   Review of Systems  All other systems reviewed and are negative.      Objective:   Physical Exam  Constitutional: She is oriented to person, place, and time.  Cardiovascular: Intact distal pulses.   Musculoskeletal: Normal range of motion.  Neurological: She is oriented to person, place, and time.  Skin: Skin is warm.  Nursing note and vitals reviewed.  neurovascular status intact muscle strength adequate range of motion within normal limits with patient found to have a hyperostosis on the medial aspect first metatarsal head right and a significant deviation of the right big toe which is leaning against the second toe and also has frontal plane deformity. 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 Posterior tibial tendinitis secondary to foot structure with flatfoot deformity bilateral    Plan:     H&P and x-rays reviewed. At this point I did go ahead and allow patient to read consent form and she needs to get something done right away due to her schedule reviewing a Austin osteotomy right with possible revered and correction  and a Akin osteotomy with frontal plane correction right. I spent a great of time going over alternative treatments complications and the fact that there is no long-term guarantees that this will solve her problem and that this is revisional surgery and there is no long-term guarantees because of bad and because of general surgery. After reviewing she signed consent form and understands recovery can be 6 months to one year. I did dispense air fracture walker at this time with instructions on usage and also we did scan for a Berkley type orthotic to lift the arch up bilateral. I did explain to her again risk and the fact we are doing this on a quick basis because of her schedule and she is encouraged to call if she should have any questions prior to procedure     X-ray report indicated that there is a history of Lapidus fusion with a mild continued increase in the intermetatarsal angle and mild increase in the proximal articular set angle and there is a distal deformity of the right hallux. Patient is noted to have flatfoot deformity bilateral

## 2015-08-28 ENCOUNTER — Encounter: Payer: Self-pay | Admitting: Family Medicine

## 2015-09-01 ENCOUNTER — Encounter: Payer: Self-pay | Admitting: Podiatry

## 2015-09-01 DIAGNOSIS — M2011 Hallux valgus (acquired), right foot: Secondary | ICD-10-CM | POA: Diagnosis not present

## 2015-09-02 ENCOUNTER — Telehealth: Payer: Self-pay | Admitting: *Deleted

## 2015-09-02 NOTE — Telephone Encounter (Addendum)
Pt states she woke this morning with throbbing and burning in her surgery foot.  I told pt to remove the cam walker, open-ended sock, and ace wrap only, elevate the foot 15 minutes, then place foot level with the hip and rewrap the ace looser, reapply sock and cam walker. I explained if the pain worsened while elevated to dangle for 15 minutes, the place level and rewrap as instructed. I instructed pt if she tolerated OTC Ibuprofen she could take as the package instructs in between doses of the Oxycodone.  Pt states understanding.  I told pt I would call again later in the day, but to call sooner if needed. Post op courtesy call-Pt states she had immediate relief when removing the tight ace wrap, and feels much better.  I reminded pt to remain in the boot at all times,especially to weight bear or walk, not to dangle, have foot below heart or weight bear more than 15 mins/hour, keep dressing clean and dry, read post op instructions carefully and to call with concerns.  I told pt the gauze covering the toes of the feet should be left in place and held in place with the ace again, and to call Thursday if pain medication is needed.  Pt states she doesn't think she'll need anymore. 01/14/2016-Pt states she would like to order a second pair of orthotics.

## 2015-09-10 ENCOUNTER — Ambulatory Visit (INDEPENDENT_AMBULATORY_CARE_PROVIDER_SITE_OTHER): Payer: BC Managed Care – PPO | Admitting: Podiatry

## 2015-09-10 ENCOUNTER — Ambulatory Visit (INDEPENDENT_AMBULATORY_CARE_PROVIDER_SITE_OTHER): Payer: BC Managed Care – PPO

## 2015-09-10 ENCOUNTER — Encounter: Payer: Self-pay | Admitting: Podiatry

## 2015-09-10 VITALS — Temp 97.8°F

## 2015-09-10 DIAGNOSIS — M79671 Pain in right foot: Secondary | ICD-10-CM | POA: Diagnosis not present

## 2015-09-10 DIAGNOSIS — M21619 Bunion of unspecified foot: Secondary | ICD-10-CM | POA: Diagnosis not present

## 2015-09-10 DIAGNOSIS — Z9889 Other specified postprocedural states: Secondary | ICD-10-CM

## 2015-09-10 NOTE — Progress Notes (Signed)
Subjective:     Patient ID: Sophia Russell, female   DOB: 1960/05/18, 55 y.o.   MRN: [id number]  HPI patient presents stating my right foot doing great and I'm so happy with how it looks and I have not had a lot of discomfort   Review of Systems     Objective:   Physical Exam Neurovascular status intact muscle strength adequate range of motion within normal limits with patient found to have a well-healing surgical site right hallux and first metatarsal with good alignment noted and good range of motion of the first MPJ with negative Homans sign noted    Assessment:     Doing very well from [place] and Akin osteotomies right    Plan:     X-ray reviewed and discussed the importance of him mobilization compression elevation and reappoint 3 weeks or earlier if needed  X-rays indicate good alignment with frontal plane correction of the big toe right which has caused some lifting of the Akin osteotomy site wires in place and it should heal well at this position

## 2015-09-25 ENCOUNTER — Encounter: Payer: Self-pay | Admitting: Family Medicine

## 2015-10-01 ENCOUNTER — Other Ambulatory Visit: Payer: Self-pay

## 2015-10-01 ENCOUNTER — Ambulatory Visit (INDEPENDENT_AMBULATORY_CARE_PROVIDER_SITE_OTHER): Payer: BC Managed Care – PPO

## 2015-10-01 ENCOUNTER — Ambulatory Visit (INDEPENDENT_AMBULATORY_CARE_PROVIDER_SITE_OTHER): Payer: BC Managed Care – PPO | Admitting: Podiatry

## 2015-10-01 DIAGNOSIS — M21619 Bunion of unspecified foot: Secondary | ICD-10-CM | POA: Diagnosis not present

## 2015-10-01 DIAGNOSIS — Z9889 Other specified postprocedural states: Secondary | ICD-10-CM

## 2015-10-01 MED ORDER — ZOLPIDEM TARTRATE 10 MG PO TABS: 10.0000 mg | ORAL_TABLET | Freq: Every evening | ORAL | 0 refills | Status: DC | PRN

## 2015-10-01 MED ORDER — RED YEAST RICE 600 MG PO TABS: 600.0000 mg | ORAL_TABLET | Freq: Three times a day (TID) | ORAL | Status: AC

## 2015-10-01 MED ORDER — KRILL OIL 1000 MG PO CAPS: 1000.0000 mg | ORAL_CAPSULE | Freq: Two times a day (BID) | ORAL | 0 refills | Status: DC

## 2015-10-01 MED ORDER — NIACIN ER 500 MG PO CPCR: 500.0000 mg | ORAL_CAPSULE | Freq: Every day | ORAL | Status: AC

## 2015-10-02 NOTE — Progress Notes (Signed)
Subjective:     Patient ID: Sophia Russell, female   DOB: Feb 18, 1960, 55 y.o.   MRN: [id number]  HPI patient states she's doing well with the surgery but note that she needs inserts to hold her arch is up due to flatfoot deformity bilateral   Review of Systems     Objective:   Physical Exam Neurovascular status intact negative Homan sign was noted with excellent alignment of the first MPJ hallux with good range of motion with approximate 30 of dorsiflexion 20 plantarflexion    Assessment:     Doing well with structural correction right with depression of the arch noted    Plan:     H&P condition reviewed and recommended long-term orthotics which are being the next visit. Patient is doing excellent and will continue with shoe gear modifications no activity on foot and gradual increase in range of motion  X-ray report indicates excellent alignment of the first MPJ and right hallux

## 2015-10-08 ENCOUNTER — Telehealth: Payer: Self-pay | Admitting: *Deleted

## 2015-10-08 NOTE — Telephone Encounter (Addendum)
Pt states she had foot surgery 09/01/2015 and would like to know if she can begin to swim. Dr. Paulla Dolly states that is fine.  I informed pt.

## 2015-10-08 NOTE — Progress Notes (Signed)
DOS 09/01/2015 [place] bunionectomy (cuting and moving bone) w/pin fixation right, Aiken Osteotomy w/wire right

## 2015-11-05 ENCOUNTER — Ambulatory Visit (INDEPENDENT_AMBULATORY_CARE_PROVIDER_SITE_OTHER): Payer: BC Managed Care – PPO | Admitting: Podiatry

## 2015-11-05 ENCOUNTER — Ambulatory Visit (INDEPENDENT_AMBULATORY_CARE_PROVIDER_SITE_OTHER): Payer: BC Managed Care – PPO

## 2015-11-05 DIAGNOSIS — M779 Enthesopathy, unspecified: Secondary | ICD-10-CM

## 2015-11-05 DIAGNOSIS — M21619 Bunion of unspecified foot: Secondary | ICD-10-CM | POA: Diagnosis not present

## 2015-11-05 DIAGNOSIS — Z9889 Other specified postprocedural states: Secondary | ICD-10-CM

## 2015-11-06 NOTE — Progress Notes (Signed)
Subjective:     Patient ID: Sophia Russell, female   DOB: 1960-09-15, 55 y.o.   MRN: [id number]  HPI patient states that she is improving but still has pain in her arch bilateral with good alignment of the first MPJ and good range of motion   Review of Systems     Objective:   Physical Exam Surgical site right healing well with depression of the arch noted with inflammation around the medial arch and posterior tibial tendon    Assessment:     Doing well post distal osteotomy right with tendinitis symptoms    Plan:     Reviewed x-ray and cast for orthotics today to lift the arch. Explained long-term utilization of orthotics to keep the arch under control and also prevent further structural problems  X-rays indicate that there is depression of the arch with well-healed surgical site right first metatarsal with pin in place

## 2015-12-10 ENCOUNTER — Encounter: Payer: Self-pay | Admitting: Family Medicine

## 2015-12-21 ENCOUNTER — Ambulatory Visit (INDEPENDENT_AMBULATORY_CARE_PROVIDER_SITE_OTHER): Payer: BC Managed Care – PPO

## 2015-12-21 DIAGNOSIS — M21619 Bunion of unspecified foot: Secondary | ICD-10-CM

## 2015-12-21 DIAGNOSIS — Z9889 Other specified postprocedural states: Secondary | ICD-10-CM

## 2015-12-21 NOTE — Patient Instructions (Signed)

## 2015-12-23 NOTE — Progress Notes (Signed)
Patient presents for orthotic pick up.  Verbal and written break in and wear instructions given.  Patient will follow up in 4 weeks if symptoms worsen or fail to improve. 

## 2016-01-15 ENCOUNTER — Ambulatory Visit: Payer: BC Managed Care – PPO | Admitting: Family Medicine

## 2016-01-18 NOTE — Telephone Encounter (Signed)
Valery:  Thank you. I will be sending that second request for orthotics today for this patient.  Rolly Pancake

## 2016-01-20 ENCOUNTER — Ambulatory Visit (INDEPENDENT_AMBULATORY_CARE_PROVIDER_SITE_OTHER): Payer: BC Managed Care – PPO | Admitting: Family Medicine

## 2016-01-20 VITALS — BP 110/82 | HR 82 | Temp 98.3°F | Ht <= 58 in | Wt 113.7 lb

## 2016-01-20 DIAGNOSIS — R05 Cough: Secondary | ICD-10-CM

## 2016-01-20 DIAGNOSIS — R059 Cough, unspecified: Secondary | ICD-10-CM

## 2016-01-20 MED ORDER — HYDROCODONE-HOMATROPINE 5-1.5 MG/5ML PO SYRP: 5.0000 mL | ORAL_SOLUTION | Freq: Four times a day (QID) | ORAL | 0 refills | Status: AC | PRN

## 2016-01-20 MED ORDER — AZITHROMYCIN 250 MG PO TABS: ORAL_TABLET | ORAL | 0 refills | Status: AC

## 2016-01-20 NOTE — Progress Notes (Signed)
Subjective:     Patient ID: Sophia Russell, female   DOB: 1960-09-10, 55 y.o.   MRN: [id number]  HPI   Patient seen for acute illness. She's had about 2 week history of some cough, sinus pain, bilateral ear pain. No fever. Initially had some headaches but none recently. No nausea or vomiting. Cough productive of yellow sputum. Patient is nonsmoker.  Past Medical History:  Diagnosis Date  . Arthritis   . DEPRESSION 01/15/2009  . HYPERLIPIDEMIA 01/15/2009  . HYPERTENSION 01/15/2009  . HYPOTHYROIDISM 01/15/2009   Past Surgical History:  Procedure Laterality Date  . [place]   x2  . FOOT SURGERY Right 2009   x3  . [place]    reports that she has never smoked. She has never used smokeless tobacco. She reports that she drinks about 1.2 oz of alcohol per week . She reports that she does not use drugs. family history is not on file. She was adopted. Allergies  Allergen Reactions  . Erythromycin Base Nausea And Vomiting    REACTION: severe stomach cramps  . Ferrous Sulfate     REACTION: severe stomach cramps  . Sulfonamide Derivatives Nausea And Vomiting    Stomach cramps     Review of Systems  Constitutional: Negative for chills and fever.  HENT: Positive for congestion.   Respiratory: Positive for cough.        Objective:   Physical Exam  Constitutional: She appears well-developed and well-nourished.  HENT:  Right Ear: External ear normal.  Left Ear: External ear normal.  Mouth/Throat: Oropharynx is clear and moist.  Neck: Neck supple.  Cardiovascular: Normal rate and regular rhythm.   Pulmonary/Chest: Effort normal and breath sounds normal. No respiratory distress. She has no wheezes. She has no rales.       Assessment:     Cough. Suspect initial viral process. She does have some facial pain suggesting possible bacterial sinusitis    Plan:     -Hycodan cough syrup at night for severe cough -Stay  well-hydrated -Zithromax for 5 days  Eulas Post MD [facility] at [facility]

## 2016-01-20 NOTE — Progress Notes (Signed)
Pre visit review using our clinic review tool, if applicable. No additional management support is needed unless otherwise documented below in the visit note. 

## 2016-01-20 NOTE — Patient Instructions (Signed)
Follow up for any persistent fever or increased shortness of breath. 

## 2016-01-27 ENCOUNTER — Other Ambulatory Visit: Payer: Self-pay | Admitting: Family Medicine

## 2016-03-07 ENCOUNTER — Encounter: Payer: Self-pay | Admitting: Family Medicine

## 2016-03-08 ENCOUNTER — Other Ambulatory Visit: Payer: Self-pay | Admitting: Family Medicine

## 2016-03-08 DIAGNOSIS — Z1231 Encounter for screening mammogram for malignant neoplasm of breast: Secondary | ICD-10-CM

## 2016-03-09 ENCOUNTER — Ambulatory Visit (INDEPENDENT_AMBULATORY_CARE_PROVIDER_SITE_OTHER): Payer: BC Managed Care – PPO

## 2016-03-09 DIAGNOSIS — Z23 Encounter for immunization: Secondary | ICD-10-CM | POA: Diagnosis not present

## 2016-03-10 ENCOUNTER — Encounter: Payer: Self-pay | Admitting: Podiatry

## 2016-03-10 ENCOUNTER — Ambulatory Visit (INDEPENDENT_AMBULATORY_CARE_PROVIDER_SITE_OTHER): Payer: BC Managed Care – PPO

## 2016-03-10 ENCOUNTER — Ambulatory Visit (INDEPENDENT_AMBULATORY_CARE_PROVIDER_SITE_OTHER): Payer: BC Managed Care – PPO | Admitting: Podiatry

## 2016-03-10 DIAGNOSIS — M21619 Bunion of unspecified foot: Secondary | ICD-10-CM

## 2016-03-10 DIAGNOSIS — B351 Tinea unguium: Secondary | ICD-10-CM

## 2016-03-10 DIAGNOSIS — S93401A Sprain of unspecified ligament of right ankle, initial encounter: Secondary | ICD-10-CM

## 2016-03-10 NOTE — Progress Notes (Signed)
Subjective:     Patient ID: Sophia Russell, female   DOB: Sep 03, 1960, 56 y.o.   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

## 2016-03-14 ENCOUNTER — Encounter: Payer: Self-pay | Admitting: Family Medicine

## 2016-03-30 ENCOUNTER — Ambulatory Visit
Admission: RE | Admit: 2016-03-30 | Discharge: 2016-03-30 | Disposition: A | Discharge status: Home / Self Care | Payer: BC Managed Care – PPO | Source: Ambulatory Visit | Attending: Family Medicine | Admitting: Family Medicine

## 2016-03-30 ENCOUNTER — Encounter: Payer: Self-pay | Admitting: Radiology

## 2016-03-30 DIAGNOSIS — Z1231 Encounter for screening mammogram for malignant neoplasm of breast: Secondary | ICD-10-CM

## 2016-04-26 ENCOUNTER — Other Ambulatory Visit (INDEPENDENT_AMBULATORY_CARE_PROVIDER_SITE_OTHER): Payer: BC Managed Care – PPO

## 2016-04-26 DIAGNOSIS — Z0000 Encounter for general adult medical examination without abnormal findings: Secondary | ICD-10-CM | POA: Diagnosis not present

## 2016-04-26 LAB — CBC WITH DIFFERENTIAL/PLATELET
BASOS PCT: 1.6 % (ref 0.0–3.0)
Basophils Absolute: 0.1 10*3/uL (ref 0.0–0.1)
EOS ABS: 0.2 10*3/uL (ref 0.0–0.7)
EOS PCT: 4.3 % (ref 0.0–5.0)
HEMATOCRIT: 39.8 % (ref 36.0–46.0)
Hemoglobin: 13.6 g/dL (ref 12.0–15.0)
Lymphocytes Relative: 49.4 % — ABNORMAL HIGH (ref 12.0–46.0)
Lymphs Abs: 2.5 10*3/uL (ref 0.7–4.0)
MCHC: 34.2 g/dL (ref 30.0–36.0)
MCV: 86.6 fl (ref 78.0–100.0)
MONO ABS: 0.4 10*3/uL (ref 0.1–1.0)
Monocytes Relative: 8.7 % (ref 3.0–12.0)
Neutro Abs: 1.8 10*3/uL (ref 1.4–7.7)
Neutrophils Relative %: 36 % — ABNORMAL LOW (ref 43.0–77.0)
PLATELETS: 255 10*3/uL (ref 150.0–400.0)
RBC: 4.59 Mil/uL (ref 3.87–5.11)
RDW: 14.6 % (ref 11.5–15.5)
WBC: 5 10*3/uL (ref 4.0–10.5)

## 2016-04-26 LAB — BASIC METABOLIC PANEL
BUN: 20 mg/dL (ref 6–23)
CHLORIDE: 106 meq/L (ref 96–112)
CO2: 28 mEq/L (ref 19–32)
CREATININE: 0.65 mg/dL (ref 0.40–1.20)
Calcium: 10 mg/dL (ref 8.4–10.5)
GFR: 101.13 mL/min (ref >60.00)
Glucose, Bld: 91 mg/dL (ref 70–99)
POTASSIUM: 4.1 meq/L (ref 3.5–5.1)
Sodium: 141 mEq/L (ref 135–145)

## 2016-04-26 LAB — LIPID PANEL
CHOL/HDL RATIO: 3
CHOLESTEROL: 214 mg/dL — AB (ref 0–200)
HDL: 63.2 mg/dL (ref >39.00)
LDL CALC: 140 mg/dL — AB (ref 0–99)
NonHDL: 150.55
Triglycerides: 55 mg/dL (ref 0.0–149.0)
VLDL: 11 mg/dL (ref 0.0–40.0)

## 2016-04-26 LAB — TSH: TSH: 2.47 u[IU]/mL (ref 0.35–4.50)

## 2016-04-26 LAB — HEPATIC FUNCTION PANEL
ALT: 24 U/L (ref 0–35)
AST: 22 U/L (ref 0–37)
Albumin: 4.5 g/dL (ref 3.5–5.2)
Alkaline Phosphatase: 64 U/L (ref 39–117)
BILIRUBIN DIRECT: 0.1 mg/dL (ref 0.0–0.3)
BILIRUBIN TOTAL: 0.6 mg/dL (ref 0.2–1.2)
TOTAL PROTEIN: 6.5 g/dL (ref 6.0–8.3)

## 2016-04-29 ENCOUNTER — Ambulatory Visit (INDEPENDENT_AMBULATORY_CARE_PROVIDER_SITE_OTHER): Payer: BC Managed Care – PPO | Admitting: Family Medicine

## 2016-04-29 ENCOUNTER — Encounter: Payer: Self-pay | Admitting: Family Medicine

## 2016-04-29 VITALS — BP 124/72 | HR 73 | Temp 98.0°F | Ht <= 58 in | Wt 114.6 lb

## 2016-04-29 DIAGNOSIS — Z0000 Encounter for general adult medical examination without abnormal findings: Secondary | ICD-10-CM

## 2016-04-29 NOTE — Progress Notes (Signed)
Pre visit review using our clinic review tool, if applicable. No additional management support is needed unless otherwise documented below in the visit note. 

## 2016-04-29 NOTE — Patient Instructions (Signed)
Let me know if interested in tapering off Effexor this summer.

## 2016-04-29 NOTE — Progress Notes (Signed)
Subjective:     Patient ID: Sophia Russell, female   DOB: 04-Oct-1960, 56 y.o.   MRN: [id number]  HPI Patient seen for physical exam. She has history of hypertension, hypothyroidism, hyperlipidemia, and major depressive disorder recurrent. She is frustrated with inability to lose weight. She thinks some of this may be related to her Effexor. Her thyroid has been well controlled and she is compliant with therapy. Colonoscopy up-to-date. Immunizations up-to-date. She exercises regularly. She had normal Pap smear last year no specific risk factors. She had mammogram just last month which was normal.  Past Medical History:  Diagnosis Date  . Arthritis   . DEPRESSION 01/15/2009  . HYPERLIPIDEMIA 01/15/2009  . HYPERTENSION 01/15/2009  . HYPOTHYROIDISM 01/15/2009   Past Surgical History:  Procedure Laterality Date  . [place]   x2  . FOOT SURGERY Right 2009   x3  . [place]    reports that she has never smoked. She has never used smokeless tobacco. She reports that she drinks about 1.2 oz of alcohol per week . She reports that she does not use drugs. family history is not on file. She was adopted. Allergies  Allergen Reactions  . Erythromycin Base Nausea And Vomiting    REACTION: severe stomach cramps  . Ferrous Sulfate     REACTION: severe stomach cramps  . Sulfonamide Derivatives Nausea And Vomiting    Stomach cramps     Review of Systems  Constitutional: Negative for activity change, appetite change, fatigue, fever and unexpected weight change.  HENT: Negative for ear pain, hearing loss, sore throat and trouble swallowing.   Eyes: Negative for visual disturbance.  Respiratory: Negative for cough and shortness of breath.   Cardiovascular: Negative for chest pain and palpitations.  Gastrointestinal: Negative for abdominal pain, blood in stool, constipation and diarrhea.  Genitourinary: Negative for dysuria and hematuria.  Musculoskeletal:  Negative for arthralgias, back pain and myalgias.  Skin: Negative for rash.  Neurological: Negative for dizziness, syncope and headaches.  Hematological: Negative for adenopathy.  Psychiatric/Behavioral: Negative for confusion and dysphoric mood.       Objective:   Physical Exam  Constitutional: She is oriented to person, place, and time. She appears well-developed and well-nourished.  HENT:  Head: Normocephalic and atraumatic.  Eyes: EOM are normal. Pupils are equal, round, and reactive to light.  Neck: Normal range of motion. Neck supple. No thyromegaly present.  Cardiovascular: Normal rate, regular rhythm and normal heart sounds.   No murmur heard. Pulmonary/Chest: Breath sounds normal. No respiratory distress. She has no wheezes. She has no rales.  Abdominal: Soft. Bowel sounds are normal. She exhibits no distension and no mass. There is no tenderness. There is no rebound and no guarding.  Musculoskeletal: Normal range of motion. She exhibits no edema.  Lymphadenopathy:    She has no cervical adenopathy.  Neurological: She is alert and oriented to person, place, and time. She displays normal reflexes. No cranial nerve deficit.  Skin: No rash noted.  Psychiatric: She has a normal mood and affect. Her behavior is normal. Judgment and thought content normal.       Assessment:     Physical exam. Labs reviewed. Her lipids are improving.    Plan:     -We decided to go to every other year Pap smears as previous paps normal and no specific risk factors -Patient expressed some interest in possibly tapering off Effexor. She would like to wait until this summer and  will readdress then if still interested -Continue regular exercise habits  Eulas Post MD [facility]

## 2016-04-30 ENCOUNTER — Other Ambulatory Visit: Payer: Self-pay | Admitting: Family Medicine

## 2016-04-30 ENCOUNTER — Encounter: Payer: Self-pay | Admitting: Family Medicine

## 2016-05-02 ENCOUNTER — Other Ambulatory Visit: Payer: Self-pay | Admitting: *Deleted

## 2016-05-02 ENCOUNTER — Encounter: Payer: BC Managed Care – PPO | Admitting: Family Medicine

## 2016-05-02 MED ORDER — LEVOTHYROXINE SODIUM 50 MCG PO TABS: 50.0000 ug | ORAL_TABLET | Freq: Every day | ORAL | 2 refills | Status: DC

## 2016-05-02 MED ORDER — LOSARTAN POTASSIUM 100 MG PO TABS: 100.0000 mg | ORAL_TABLET | Freq: Every day | ORAL | 2 refills | Status: DC

## 2016-05-02 MED ORDER — FLUOCINONIDE 0.05 % EX SOLN: CUTANEOUS | 1 refills | Status: DC

## 2016-05-02 MED ORDER — VENLAFAXINE HCL ER 150 MG PO CP24: 150.0000 mg | ORAL_CAPSULE | Freq: Every day | ORAL | 3 refills | Status: DC

## 2016-05-03 ENCOUNTER — Encounter: Payer: BC Managed Care – PPO | Admitting: Family Medicine

## 2016-07-07 ENCOUNTER — Encounter: Payer: Self-pay | Admitting: Family Medicine

## 2016-10-27 ENCOUNTER — Encounter: Payer: Self-pay | Admitting: Family Medicine

## 2016-12-08 ENCOUNTER — Other Ambulatory Visit: Payer: Self-pay | Admitting: Family Medicine

## 2017-02-03 ENCOUNTER — Ambulatory Visit: Payer: BC Managed Care – PPO | Admitting: Family Medicine

## 2017-02-08 ENCOUNTER — Ambulatory Visit: Payer: BC Managed Care – PPO | Admitting: Family Medicine

## 2017-02-08 ENCOUNTER — Encounter: Payer: Self-pay | Admitting: Family Medicine

## 2017-02-08 VITALS — BP 138/80 | HR 56 | Temp 98.5°F | Wt 115.0 lb

## 2017-02-08 DIAGNOSIS — E039 Hypothyroidism, unspecified: Secondary | ICD-10-CM | POA: Diagnosis not present

## 2017-02-08 DIAGNOSIS — Z23 Encounter for immunization: Secondary | ICD-10-CM | POA: Diagnosis not present

## 2017-02-08 DIAGNOSIS — I10 Essential (primary) hypertension: Secondary | ICD-10-CM

## 2017-02-08 DIAGNOSIS — E785 Hyperlipidemia, unspecified: Secondary | ICD-10-CM | POA: Diagnosis not present

## 2017-02-08 NOTE — Progress Notes (Signed)
Subjective:     Patient ID: Sophia Russell, female   DOB: 1960-11-04, 57 y.o.   MRN: [id number]  HPI Seen for the following issues  Follow-up hypothyroidism and hyperlipidemia. She has gone on ketogenic diet this year and wishes to have her lipids rechecked. LDL last year 140. She also is taking some over-the-counter red yeast rice extract and low-dose niacin. No history of premature CAD  Hypothyroidism on replacement. She also has hypertension treated with losartan and amlodipine. Blood pressure stable. No headaches. No dizziness. No chest pains.  She noticed a small "spot" under her right lower eyelid recently. Asymptomatic. She noticed one day when putting on her makeup.  Past Medical History:  Diagnosis Date  . Arthritis   . DEPRESSION 01/15/2009  . HYPERLIPIDEMIA 01/15/2009  . HYPERTENSION 01/15/2009  . HYPOTHYROIDISM 01/15/2009   Past Surgical History:  Procedure Laterality Date  . [place]   x2  . FOOT SURGERY Right 2009   x3  . [place]    reports that  has never smoked. she has never used smokeless tobacco. She reports that she drinks about 1.2 oz of alcohol per week. She reports that she does not use drugs. family history includes Hyperlipidemia in her unknown relative; Hypertension in her unknown relative. She was adopted. Allergies  Allergen Reactions  . Erythromycin Base Nausea And Vomiting    REACTION: severe stomach cramps  . Ferrous Sulfate     REACTION: severe stomach cramps  . Sulfonamide Derivatives Nausea And Vomiting    Stomach cramps     Review of Systems  Constitutional: Negative for fatigue and unexpected weight change.  Eyes: Negative for visual disturbance.  Respiratory: Negative for cough, chest tightness, shortness of breath and wheezing.   Cardiovascular: Negative for chest pain, palpitations and leg swelling.  Endocrine: Negative for polydipsia and polyuria.  Neurological: Negative for dizziness,  seizures, syncope, weakness, light-headedness and headaches.       Objective:   Physical Exam  Constitutional: She appears well-developed and well-nourished.  Eyes: Pupils are equal, round, and reactive to light.  Neck: Neck supple. No JVD present. No thyromegaly present.  Cardiovascular: Normal rate and regular rhythm. Exam reveals no gallop.  Pulmonary/Chest: Effort normal and breath sounds normal. No respiratory distress. She has no wheezes. She has no rales.  Musculoskeletal: She exhibits no edema.  Neurological: She is alert.  Skin:  Very small cystic approximately 1 mm elevation below the right lower lid. No visible surface changes.       Assessment:     #1 hypertension stable  #2 hypothyroidism  #3 question small benign cyst right face  #4 hyperlipidemia    Plan:     -Patient will return for fasting TSH and lipids later in the month -Follow-up for any rapid growth or changes to right face lesion -Flu vaccine given -continue with current medications.  Eulas Post MD [facility] at [facility]

## 2017-02-14 ENCOUNTER — Encounter: Payer: Self-pay | Admitting: Family Medicine

## 2017-02-14 ENCOUNTER — Other Ambulatory Visit: Payer: Self-pay | Admitting: Family Medicine

## 2017-02-14 DIAGNOSIS — Z1231 Encounter for screening mammogram for malignant neoplasm of breast: Secondary | ICD-10-CM

## 2017-03-01 ENCOUNTER — Encounter: Payer: Self-pay | Admitting: Family Medicine

## 2017-03-01 ENCOUNTER — Other Ambulatory Visit (INDEPENDENT_AMBULATORY_CARE_PROVIDER_SITE_OTHER): Payer: BC Managed Care – PPO

## 2017-03-01 DIAGNOSIS — E039 Hypothyroidism, unspecified: Secondary | ICD-10-CM

## 2017-03-01 DIAGNOSIS — E785 Hyperlipidemia, unspecified: Secondary | ICD-10-CM | POA: Diagnosis not present

## 2017-03-01 LAB — LIPID PANEL
CHOLESTEROL: 238 mg/dL — AB (ref 0–200)
HDL: 68.5 mg/dL (ref >39.00)
LDL Cholesterol: 161 mg/dL — ABNORMAL HIGH (ref 0–99)
NonHDL: 169.82
TRIGLYCERIDES: 43 mg/dL (ref 0.0–149.0)
Total CHOL/HDL Ratio: 3
VLDL: 8.6 mg/dL (ref 0.0–40.0)

## 2017-03-01 LAB — TSH: TSH: 1.79 u[IU]/mL (ref 0.35–4.50)

## 2017-04-11 ENCOUNTER — Ambulatory Visit: Payer: BC Managed Care – PPO

## 2017-04-28 ENCOUNTER — Other Ambulatory Visit: Payer: Self-pay | Admitting: Family Medicine

## 2017-04-28 MED ORDER — ZOLPIDEM TARTRATE 10 MG PO TABS: 10.0000 mg | ORAL_TABLET | Freq: Every evening | ORAL | 0 refills | Status: AC | PRN

## 2017-04-28 NOTE — Telephone Encounter (Signed)
Refill OK

## 2017-05-01 ENCOUNTER — Ambulatory Visit
Admission: RE | Admit: 2017-05-01 | Discharge: 2017-05-01 | Disposition: A | Discharge status: Home / Self Care | Payer: BC Managed Care – PPO | Source: Ambulatory Visit | Attending: Family Medicine | Admitting: Family Medicine

## 2017-05-01 DIAGNOSIS — Z1231 Encounter for screening mammogram for malignant neoplasm of breast: Secondary | ICD-10-CM

## 2017-05-07 ENCOUNTER — Other Ambulatory Visit: Payer: Self-pay | Admitting: Family Medicine

## 2017-05-30 ENCOUNTER — Encounter: Payer: Self-pay | Admitting: Family Medicine

## 2017-05-30 ENCOUNTER — Ambulatory Visit (INDEPENDENT_AMBULATORY_CARE_PROVIDER_SITE_OTHER): Payer: BC Managed Care – PPO | Admitting: Family Medicine

## 2017-05-30 VITALS — BP 104/80 | HR 79 | Temp 98.4°F | Ht <= 58 in

## 2017-05-30 DIAGNOSIS — L820 Inflamed seborrheic keratosis: Secondary | ICD-10-CM

## 2017-05-30 NOTE — Patient Instructions (Signed)
We will set you up to see Dr. Elease Hashimoto your primary care physician for removal of this lesion. Appears to be an irritated seborrheic keratosis

## 2017-05-30 NOTE — Progress Notes (Signed)
Sophia Russell is a 57 year old female who called today about a mole she's had in her back for 4 weeks who was subsequently told to come is an emergency.  Patient Dr. Elease Hashimoto  On physical exam she has a 5 mm x 5 mm seborrheic keratosis mid back. Refer to her PCP Dr. Elease Hashimoto for removal  No charge for today

## 2017-06-06 ENCOUNTER — Ambulatory Visit (INDEPENDENT_AMBULATORY_CARE_PROVIDER_SITE_OTHER): Payer: BC Managed Care – PPO | Admitting: Family Medicine

## 2017-06-06 ENCOUNTER — Encounter: Payer: Self-pay | Admitting: Family Medicine

## 2017-06-06 VITALS — BP 110/70 | HR 90 | Temp 98.4°F | Wt 110.8 lb

## 2017-06-06 DIAGNOSIS — L989 Disorder of the skin and subcutaneous tissue, unspecified: Secondary | ICD-10-CM

## 2017-06-06 NOTE — Patient Instructions (Signed)
Keep wound dry for the first 24 hours then clean daily with soap and water for one week. Apply small amount of vaseline daily for 3-4 days. Keep covered with clean dressing for 4-5 days. Follow up promptly for any signs of infection such as redness, warmth, pain, or drainage.

## 2017-06-06 NOTE — Progress Notes (Signed)
  Subjective:     Patient ID: Sophia Russell, female   DOB: 1960-12-24, 57 y.o.   MRN: [id number]  HPI Patient seen with irritated "spot "on her right upper back which she noted a few months ago. Occasional itching. No bleeding. She states she has scraped part of this off couple times but seemed to re-grow each time. She does not have any personal history of skin cancer. She is requesting that this be excised if possible. Was actually seen here last week and diagnosed with probable inflamed or irritated seborrheic keratosis  Past Medical History:  Diagnosis Date  . Arthritis   . DEPRESSION 01/15/2009  . HYPERLIPIDEMIA 01/15/2009  . HYPERTENSION 01/15/2009  . HYPOTHYROIDISM 01/15/2009   Past Surgical History:  Procedure Laterality Date  . [place]   x2  . FOOT SURGERY Right 2009   x3  . [place]    reports that she has never smoked. She has never used smokeless tobacco. She reports that she drinks about 1.2 oz of alcohol per week. She reports that she does not use drugs. family history includes Hyperlipidemia in her unknown relative; Hypertension in her unknown relative. She was adopted. Allergies  Allergen Reactions  . Erythromycin Base Nausea And Vomiting    REACTION: severe stomach cramps  . Ferrous Sulfate     REACTION: severe stomach cramps  . Sulfonamide Derivatives Nausea And Vomiting    Stomach cramps     Review of Systems  Skin: Negative for rash.  Hematological: Negative for adenopathy.       Objective:   Physical Exam  Constitutional: She appears well-developed and well-nourished.  Skin:  Patient has approximately 4 x 6 mm area right upper back which is slightly raised and slightly scaly. No ulceration.       Assessment:     Probable irritated seborrheic keratosis right upper back    Plan:     -Patient requesting excision -Discussed risks and benefits of shave excision including risks of bleeding, bruising,  scar formation, and infection and patient consented to proceed.  Skin prepped with betadine and alcohol.  Local anesthesia with 1% xylocaine with epi.   shave excision of lesion with #15 blade with minimal bleeding.  Patient tolerated well.  Topical vaseline and dressing  Applied.  Eulas Post MD [facility] at [facility]

## 2017-06-23 ENCOUNTER — Encounter: Payer: Self-pay | Admitting: Family Medicine

## 2017-06-26 MED ORDER — VENLAFAXINE HCL ER 75 MG PO CP24: ORAL_CAPSULE | ORAL | 0 refills | Status: DC

## 2017-06-26 MED ORDER — VENLAFAXINE HCL ER 37.5 MG PO CP24: ORAL_CAPSULE | ORAL | 0 refills | Status: DC

## 2017-07-04 ENCOUNTER — Telehealth: Payer: Self-pay | Admitting: *Deleted

## 2017-07-04 ENCOUNTER — Ambulatory Visit: Payer: BC Managed Care – PPO | Admitting: Family Medicine

## 2017-07-04 ENCOUNTER — Encounter: Payer: Self-pay | Admitting: Family Medicine

## 2017-07-04 VITALS — BP 110/70 | HR 69 | Temp 98.3°F | Wt 109.0 lb

## 2017-07-04 DIAGNOSIS — J029 Acute pharyngitis, unspecified: Secondary | ICD-10-CM

## 2017-07-04 NOTE — Patient Instructions (Signed)
Consider over the counter sudafed for ear pain  No signs of infection at this time.

## 2017-07-04 NOTE — Telephone Encounter (Signed)
Patient requesting Shingrix vaccine. Okay to administer?

## 2017-07-04 NOTE — Telephone Encounter (Signed)
OK 

## 2017-07-04 NOTE — Progress Notes (Signed)
  Subjective:     Patient ID: Sophia Russell, female   DOB: June 07, 1960, 57 y.o.   MRN: [id number]  HPI Patient seen with one-day history of left ear pain. She's also had some sore throat. No fever. Denies any nasal congestion. No cough. No adenopathy. She has some pain with swallowing mostly on the left side.  No hearing loss. No vertigo. No alleviating or exacerbating factors  Past Medical History:  Diagnosis Date  . Arthritis   . DEPRESSION 01/15/2009  . HYPERLIPIDEMIA 01/15/2009  . HYPERTENSION 01/15/2009  . HYPOTHYROIDISM 01/15/2009   Past Surgical History:  Procedure Laterality Date  . [place]   x2  . FOOT SURGERY Right 2009   x3  . [place]    reports that she has never smoked. She has never used smokeless tobacco. She reports that she drinks about 1.2 oz of alcohol per week. She reports that she does not use drugs. family history includes Hyperlipidemia in her unknown relative; Hypertension in her unknown relative. She was adopted. Allergies  Allergen Reactions  . Erythromycin Base Nausea And Vomiting    REACTION: severe stomach cramps  . Ferrous Sulfate     REACTION: severe stomach cramps  . Sulfonamide Derivatives Nausea And Vomiting    Stomach cramps     Review of Systems  Constitutional: Negative for chills and fever.  HENT: Positive for ear pain and sore throat. Negative for ear discharge.   Respiratory: Negative for cough.        Objective:   Physical Exam  Constitutional: She appears well-developed and well-nourished.  HENT:  Right Ear: Tympanic membrane normal.  Left Ear: Tympanic membrane normal.  Mouth/Throat: Oropharynx is clear and moist. No uvula swelling. No oropharyngeal exudate, posterior oropharyngeal edema or tonsillar abscesses.  Neck: Normal range of motion. Neck supple.  Cardiovascular: Normal rate and regular rhythm.  Pulmonary/Chest: Effort normal and breath sounds normal. She has no wheezes.  She has no rales.  Lymphadenopathy:    She has no cervical adenopathy.  Skin: No rash noted.       Assessment:     Left otalgia with normal exam. Suspect her ear pain most likely referred pain from her throat. ?early aphthous ulcer.  She has only some mild erythema on exam otherwise nonfocal exam    Plan:     -Treat symptomatically with salt water gargles and over-the-counter Aleve or ibuprofen as needed -Follow-up promptly for any fever or worsening symptoms  Eulas Post MD [facility] at [facility]

## 2017-07-05 NOTE — Telephone Encounter (Signed)
Nurse visit scheduled for Shingrix injection.

## 2017-07-06 ENCOUNTER — Ambulatory Visit: Payer: BC Managed Care – PPO

## 2017-07-07 ENCOUNTER — Ambulatory Visit (INDEPENDENT_AMBULATORY_CARE_PROVIDER_SITE_OTHER): Payer: BC Managed Care – PPO | Admitting: Family Medicine

## 2017-07-07 DIAGNOSIS — Z23 Encounter for immunization: Secondary | ICD-10-CM

## 2017-08-30 ENCOUNTER — Encounter: Payer: Self-pay | Admitting: Family Medicine

## 2017-08-31 MED ORDER — FLUOCINONIDE 0.05 % EX SOLN: CUTANEOUS | 1 refills | Status: AC

## 2017-09-07 ENCOUNTER — Ambulatory Visit: Payer: Self-pay

## 2017-09-07 NOTE — Telephone Encounter (Signed)
It sounds like she wants to start back the Effexor?  If so, would start back Effexor XR 37.5  mg once daily for one week and then increase to Effexor XR 75 mg once daily.  She will also need to establish with new primary to reassess.

## 2017-09-07 NOTE — Telephone Encounter (Signed)
I spoke with pt, explained that Dr. Elease Hashimoto is out of the office today, will be returning on 09/08/2017. Patient did ask if another provider could prescribe medication requested and I advised she would need a office visit, she lives out of town and unable to schedule this. I did tell patient that I would forward this note to Dr, Elease Hashimoto for review, however it would be tomorrow before this message can be reviewed.

## 2017-09-07 NOTE — Telephone Encounter (Signed)
Pt. Reports she has tapered off her Effexor with Dr. Erick Blinks help. Was doing well at first.Has moved to [facility]. And started a new job. Feels like she needs to go back on the Effexor. Feels sad,"crying a lot the past 4-5 days." Feels exhausted. Has not had time to start with a new provider yet. Her contact number [phone]. Using [facility] [address] [place]. [id number]. Please advise pt.  Answer Assessment - Initial Assessment Questions 1. CONCERN: "What happened that made you call today?"     This week started feeling sad. Tapered off her Effexor 2. DEPRESSION SYMPTOM SCREENING: "How are you feeling overall?" (e.g., decreased energy, increased sleeping or difficulty sleeping, difficulty concentrating, feelings of sadness, guilt, hopelessness, or worthlessness)     Sad and not myself 3. RISK OF HARM - SUICIDAL IDEATION:  "Do you ever have thoughts of hurting or killing yourself?"  (e.g., yes, no, no but preoccupation with thoughts about death)   - INTENT:  "Do you have thoughts of hurting or killing yourself right NOW?" (e.g., yes, no, N/A)   - PLAN: "Do you have a specific plan for how you would do this?" (e.g., gun, knife, overdose, no plan, N/A)     No 4. RISK OF HARM - HOMICIDAL IDEATION:  "Do you ever have thoughts of hurting or killing someone else?"  (e.g., yes, no, no but preoccupation with thoughts about death)   - INTENT:  "Do you have thoughts of hurting or killing someone right NOW?" (e.g., yes, no, N/A)   - PLAN: "Do you have a specific plan for how you would do this?" (e.g., gun, knife, no plan, N/A)      No 5. FUNCTIONAL IMPAIRMENT: "How have things been going for you overall in your life? Have you had any more difficulties than usual doing your normal daily activities?"  (e.g., better, same, worse; self-care, school, work, interactions)     Has moved to [place] and has started a new job. 6. SUPPORT: "Who is with you now?" "Who do you live with?" "Do you  have family or friends nearby who you can talk to?"      Daughter is with her 13. THERAPIST: "Do you have a counselor or therapist? Name?"     No 8. STRESSORS: "Has there been any new stress or recent changes in your life?"     New job 9. DRUG ABUSE/ALCOHOL: "Do you drink alcohol or use any illegal drugs?"      No 10. OTHER: "Do you have any other health or medical symptoms right now?" (e.g., fever)       No 11. PREGNANCY: "Is there any chance you are pregnant?" "When was your last menstrual period?"       No  Protocols used: DEPRESSION-A-AH

## 2017-09-07 NOTE — Telephone Encounter (Signed)
Triage note reviewed. Pt w/ h/o depression stable on Effexor, recently tapered off and last week messaged PCP that she was doing very well off medication. Now appears to have some relapse, ? If r/t recent move + starting new job. Patient denies SI/HI per triage. Will forward to PCP for review/recommendations.

## 2017-09-08 ENCOUNTER — Other Ambulatory Visit: Payer: Self-pay | Admitting: *Deleted

## 2017-09-08 MED ORDER — VENLAFAXINE HCL ER 37.5 MG PO CP24: ORAL_CAPSULE | ORAL | 0 refills | Status: DC

## 2017-09-08 NOTE — Telephone Encounter (Signed)
Patient is aware and Rx sent 

## 2017-09-17 ENCOUNTER — Encounter: Payer: Self-pay | Admitting: Family Medicine

## 2017-09-18 ENCOUNTER — Other Ambulatory Visit: Payer: Self-pay | Admitting: Family Medicine

## 2017-09-19 MED ORDER — VENLAFAXINE HCL ER 150 MG PO CP24: 150.0000 mg | ORAL_CAPSULE | Freq: Every day | ORAL | 0 refills | Status: AC

## 2017-10-03 ENCOUNTER — Other Ambulatory Visit: Payer: Self-pay | Admitting: Family Medicine

## 2017-10-05 ENCOUNTER — Other Ambulatory Visit: Payer: Self-pay | Admitting: Family Medicine

## 2018-08-14 IMAGING — MG DIGITAL SCREENING BILATERAL MAMMOGRAM WITH TOMO AND CAD
8 series · 9 of 24 positions shown · non-contrast
Comparison: Previous exam(s).

CLINICAL DATA: Screening.

EXAM:
DIGITAL SCREENING BILATERAL MAMMOGRAM WITH TOMO AND CAD

[R MLO synth-2D]
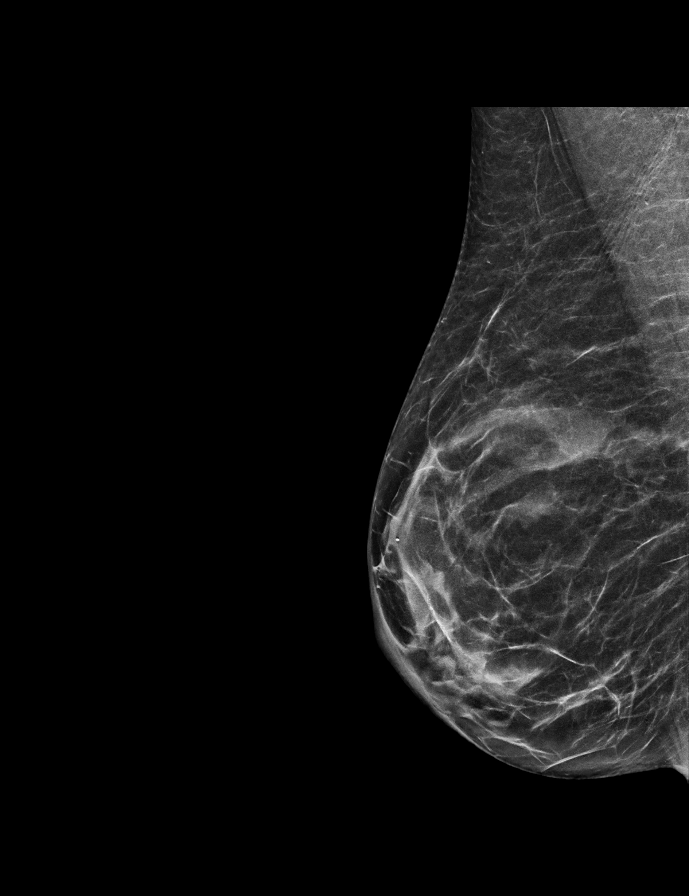

[L MLO synth-2D]
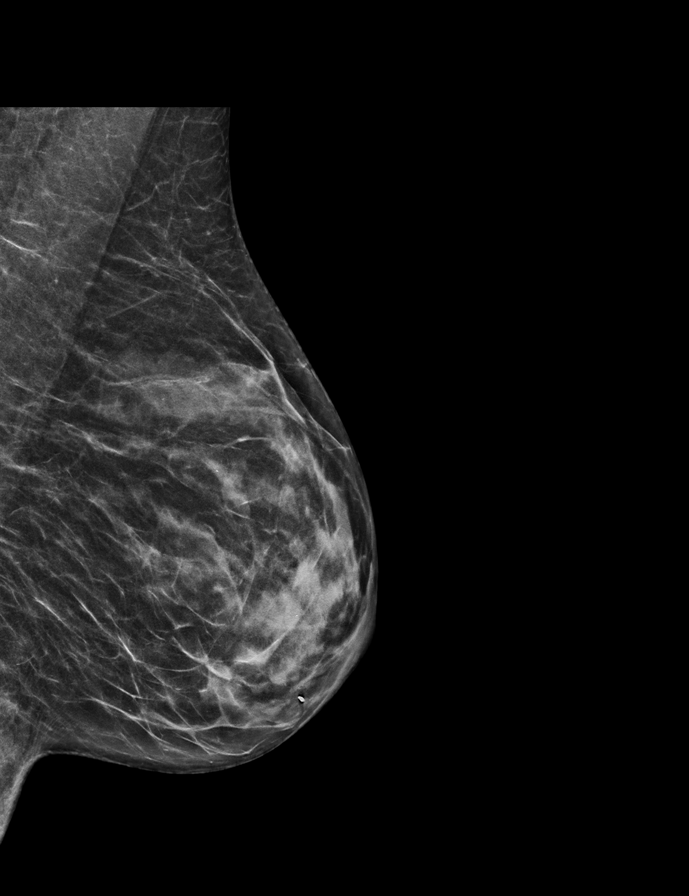

[L CC synth-2D]
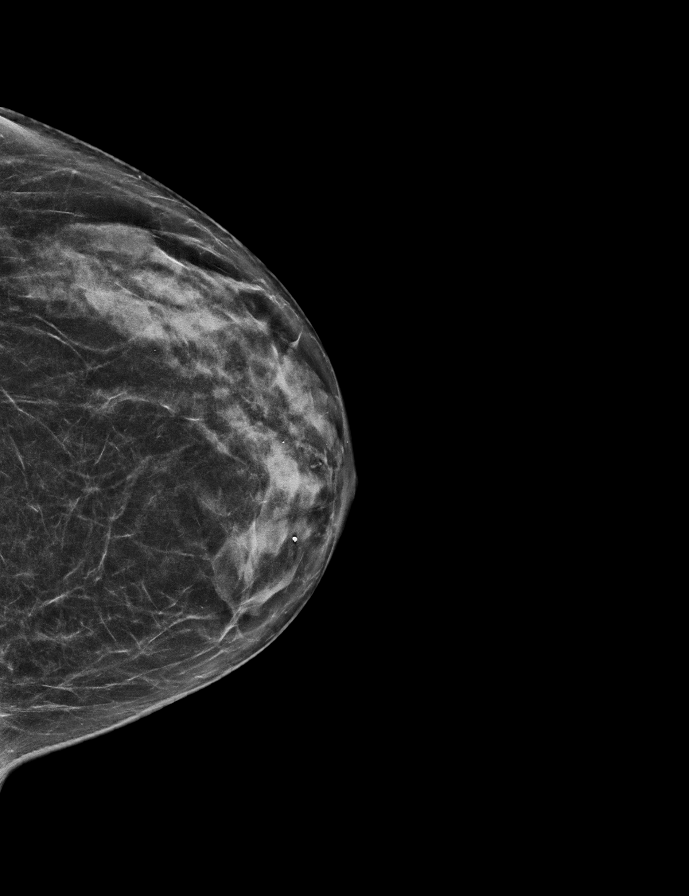

[R CC synth-2D]
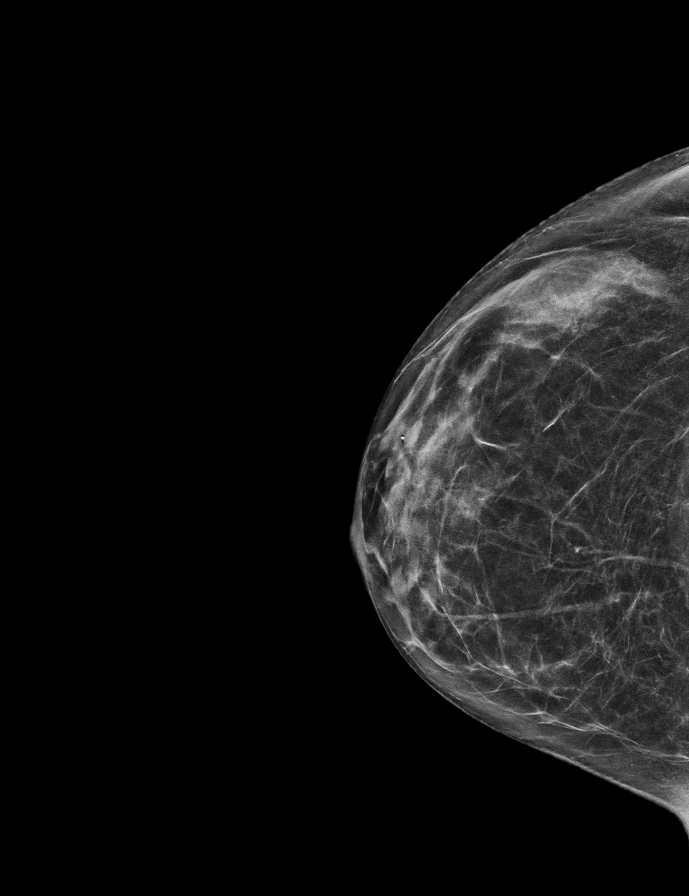

[L MLO tomo · 2 of 54 frames shown]
[frame 18/54]
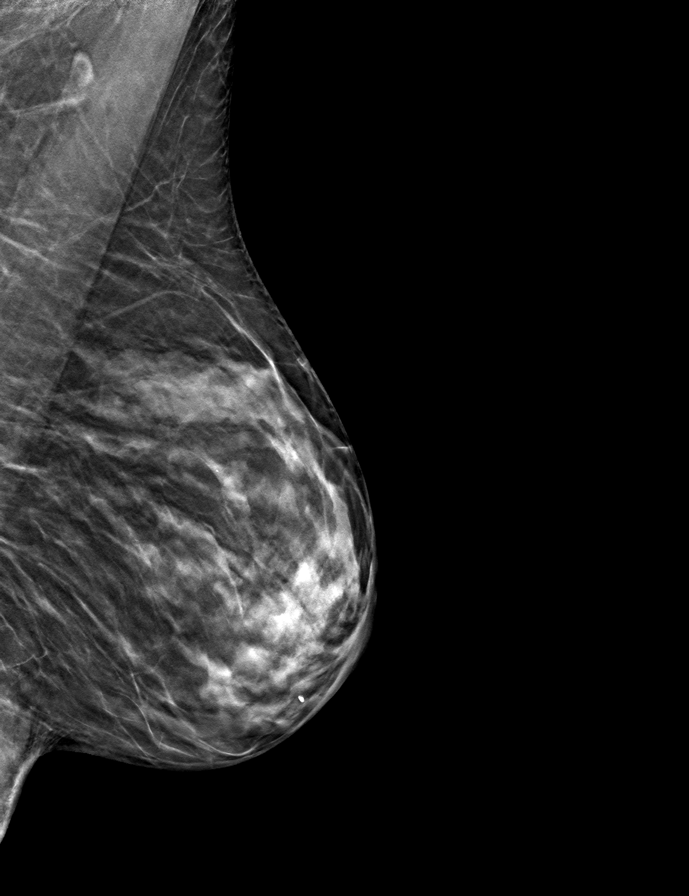
[frame 27/54]
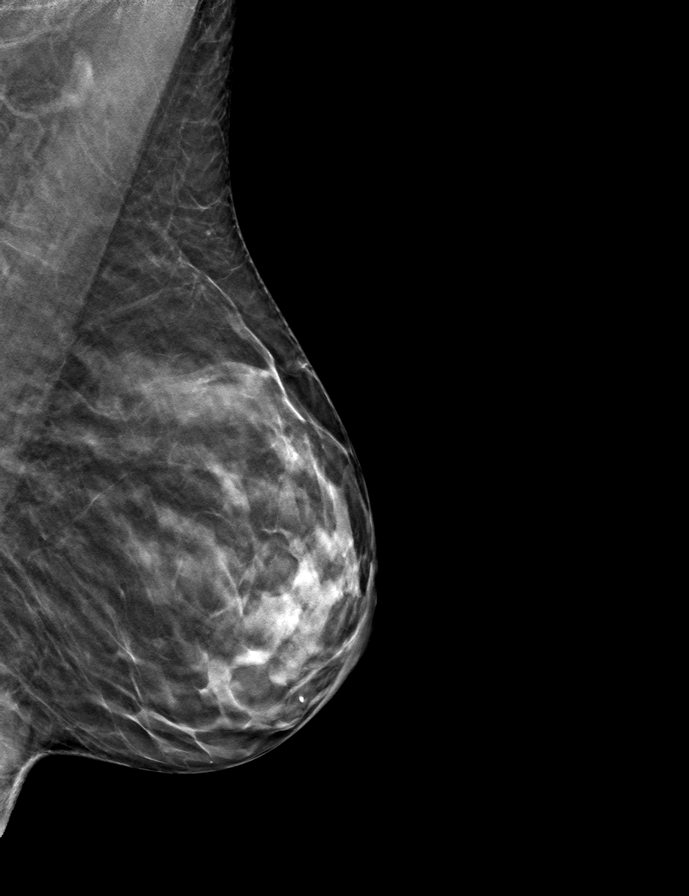

[R MLO tomo · tomo slice 27/54.0]
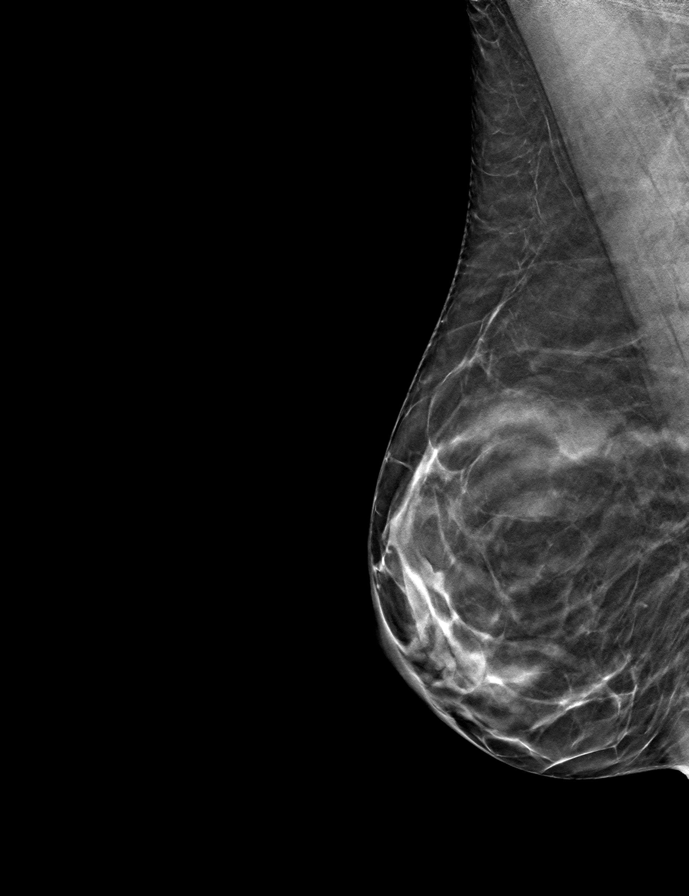

[R CC tomo · tomo slice 29/56.0]
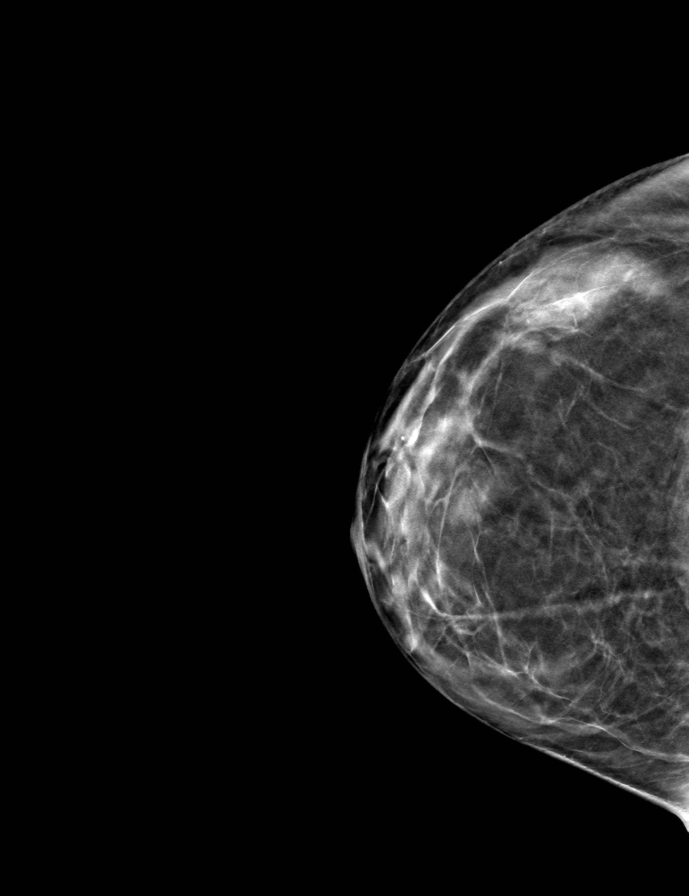

[L CC tomo · tomo slice 28/55.0]
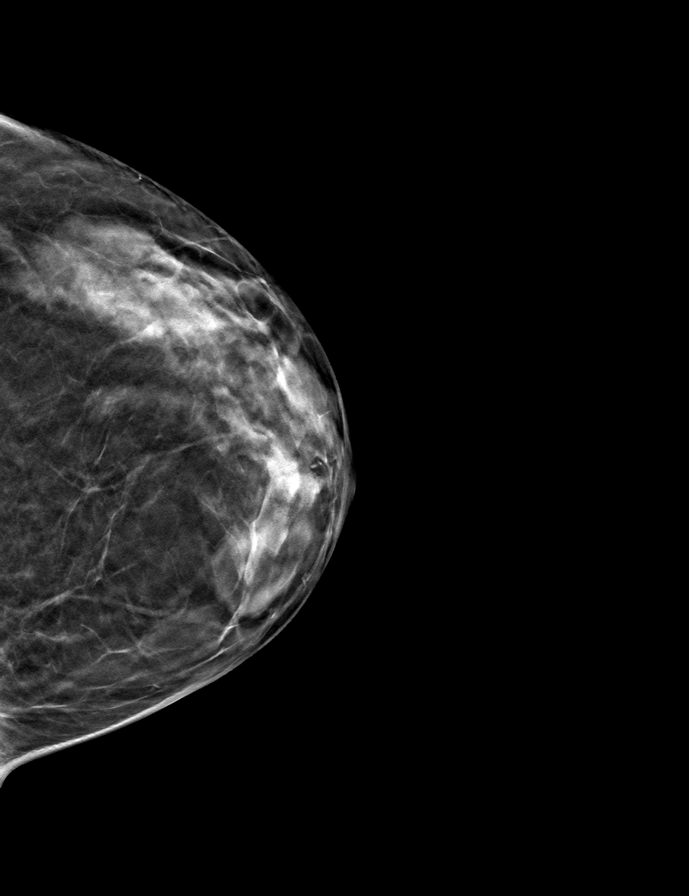

[9 of 24 positions shown; findings below may reference images not displayed]

ACR Breast Density Category b: There are scattered areas of
fibroglandular density.
FINDINGS: There are no findings suspicious for malignancy. Images were
processed with CAD.
IMPRESSION: No mammographic evidence of malignancy. A result letter of this
screening mammogram will be mailed directly to the patient.

RECOMMENDATION:
Screening mammogram in one year. (Code:[id number])

BI-RADS CATEGORY  1: Negative.

## 2019-08-13 ENCOUNTER — Encounter: Payer: Self-pay | Admitting: Gastroenterology
